# Patient Record
Sex: Female | Born: 1975 | Race: White | Hispanic: No | Marital: Married | Smoking: Never smoker
Health system: Southern US, Community
[De-identification: ages and names within clinical notes are randomized; demographics above are authoritative.]

## PROBLEM LIST (undated history)

## (undated) DIAGNOSIS — I10 Essential (primary) hypertension: Secondary | ICD-10-CM

## (undated) DIAGNOSIS — G43909 Migraine, unspecified, not intractable, without status migrainosus: Secondary | ICD-10-CM

## (undated) HISTORY — DX: Migraine, unspecified, not intractable, without status migrainosus: G43.909

## (undated) HISTORY — DX: Essential (primary) hypertension: I10

## (undated) HISTORY — PX: WISDOM TOOTH EXTRACTION: SHX21

---

## 2005-01-09 ENCOUNTER — Other Ambulatory Visit: Admission: RE | Admit: 2005-01-09 | Discharge: 2005-01-09 | Payer: Self-pay | Admitting: Obstetrics and Gynecology

## 2005-08-04 ENCOUNTER — Inpatient Hospital Stay (HOSPITAL_COMMUNITY): Admission: AD | Admit: 2005-08-04 | Discharge: 2005-08-07 | Payer: Self-pay | Admitting: Obstetrics and Gynecology

## 2009-06-25 ENCOUNTER — Inpatient Hospital Stay (HOSPITAL_COMMUNITY): Admission: AD | Admit: 2009-06-25 | Discharge: 2009-06-26 | Payer: Self-pay | Admitting: Obstetrics and Gynecology

## 2010-07-31 LAB — CBC
MCHC: 33.7 g/dL (ref 30.0–36.0)
MCV: 94.3 fL (ref 78.0–100.0)
Platelets: 146 10*3/uL — ABNORMAL LOW (ref 150–400)
Platelets: 188 10*3/uL (ref 150–400)
RDW: 13.7 % (ref 11.5–15.5)
RDW: 14 % (ref 11.5–15.5)

## 2010-07-31 LAB — RPR: RPR Ser Ql: NONREACTIVE

## 2015-11-09 DIAGNOSIS — G43019 Migraine without aura, intractable, without status migrainosus: Secondary | ICD-10-CM | POA: Diagnosis not present

## 2015-11-09 DIAGNOSIS — G43839 Menstrual migraine, intractable, without status migrainosus: Secondary | ICD-10-CM | POA: Diagnosis not present

## 2016-11-09 LAB — HM PAP SMEAR: HM PAP: NORMAL

## 2016-11-16 LAB — HM MAMMOGRAPHY: HM Mammogram: NORMAL (ref 0–4)

## 2016-12-17 DIAGNOSIS — Z1231 Encounter for screening mammogram for malignant neoplasm of breast: Secondary | ICD-10-CM | POA: Diagnosis not present

## 2016-12-17 DIAGNOSIS — Z6821 Body mass index (BMI) 21.0-21.9, adult: Secondary | ICD-10-CM | POA: Diagnosis not present

## 2016-12-17 DIAGNOSIS — Z01419 Encounter for gynecological examination (general) (routine) without abnormal findings: Secondary | ICD-10-CM | POA: Diagnosis not present

## 2017-05-19 ENCOUNTER — Ambulatory Visit: Payer: BLUE CROSS/BLUE SHIELD | Admitting: Family

## 2017-05-19 ENCOUNTER — Encounter: Payer: Self-pay | Admitting: Family

## 2017-05-19 VITALS — BP 152/101 | HR 85 | Temp 98.4°F | Resp 16 | Ht 67.5 in | Wt 136.0 lb

## 2017-05-19 DIAGNOSIS — G43909 Migraine, unspecified, not intractable, without status migrainosus: Secondary | ICD-10-CM | POA: Insufficient documentation

## 2017-05-19 DIAGNOSIS — I10 Essential (primary) hypertension: Secondary | ICD-10-CM

## 2017-05-19 MED ORDER — SUMATRIPTAN SUCCINATE 100 MG PO TABS: 100.0000 mg | ORAL_TABLET | ORAL | 0 refills | Status: DC | PRN

## 2017-05-19 MED ORDER — AMLODIPINE BESYLATE 5 MG PO TABS: 5.0000 mg | ORAL_TABLET | Freq: Every day | ORAL | 3 refills | Status: DC

## 2017-05-19 NOTE — Assessment & Plan Note (Signed)
Stable with as needed use of Imitrex.  She is [facility].

## 2017-05-19 NOTE — Assessment & Plan Note (Signed)
New-onset.  Will initiate amlodipine 5 mg once daily.

## 2017-05-19 NOTE — Progress Notes (Signed)
Subjective:    Patient ID: Marissa Levine, female    DOB: 04-01-1976, 42 y.o.   MRN: [id number]  HPI  Patient is a 42 year old female who presents today to establish care.  Her chief concern today is elevated blood pressure.  Reports BP is high at her GYN and again when she went to ophthalmology.  She has been monitoring her blood pressure at home.  Systolic blood pressure has been in the 140s-160s.  She is only had one blood pressure less than 140.  She reports a strong family history of hypertension. BP Readings from Last 3 Encounters:  05/19/17 (!) 152/101   She has lost 30 pounds over the past year.  This has been with healthy diet and exercise.    M[facility]raines- [facility]. Controlled with imitrex prn. Has a few a year.   Review of Systems  Constitutional: Negative for unexpected weight change.  HENT: Negative for hearing loss and rhinorrhea.   Eyes: Negative for visual disturbance.  Respiratory: Negative for cough.   Cardiovascular: Negative for leg swelling.  Gastrointestinal: Negative for blood in stool, constipation and diarrhea.  Genitourinary: Negative for dysuria, frequency and menstrual problem.  Musculoskeletal: Negative for arthralgias and myalgias.  Skin: Negative for rash.  Neurological:       HA's see HPI  Hematological: Negative for adenopathy.  Psychiatric/Behavioral:       Denies depression/anxiety    History reviewed. No pertinent past medical history.   Social History   Socioeconomic History  . Marital status: Married    Spouse name: Glendell Docker  . Number of children: 2  . Years of education: 6  . Highest education level: Bachelor's degree (e.g., BA, AB, BS)  Social Needs  . Financial resource strain: Not hard at all  . Food insecurity - worry: Patient refused  . Food insecurity - inability: Patient refused  . Transportation needs - medical: Patient refused  . Transportation needs - non-medical: Patient refused  Occupational History    . 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 Smoking status: Never Smoker  . Smokeless tobacco: Never Used  Substance and Sexual Activity  . Alcohol use: Yes    Alcohol/week: 0.6 oz    Types: 1 Glasses of wine per week    Comment: glass wine with diner  . Drug use: No  . Sexual activity: Yes    Partners: Male    Birth control/protection: None    Comment: Husband had vasectomy  Other Topics Concern  . 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 No pertinent surgical history.  Family History  Problem Relation Age of Onset  . Cancer Mother 84       breast  . Hypertension Mother   . Cancer Father        basal cell carcinoma  . Hypertension Father   . Hypertension Maternal Grandmother   . Hypertension Maternal Grandfather   . Hypertension Paternal Grandmother   . Hypertension Paternal Grandfather   . Heart attack Paternal Grandfather     Not on File  No current outpatient medications on file prior to visit.   No current facility-administered medications on file prior to visit.     BP Marland Kitchen)  152/101 (BP Location: Right Arm, Patient Position: Sitting, Cuff Size: Small)   Pulse 85   Temp 98.4 F (36.9 C) (Oral)   Resp 16   Ht 5' 7.5" (1.715 m)   Wt 136 lb (61.7 kg)   LMP 05/05/2017 Comment: Husband had a vasectomy  SpO2 100%   BMI 20.99 kg/m        Objective:   Physical Exam  Constitutional: She is oriented to person, place, and time. She appears well-developed and well-nourished.  HENT:  Head: Normocephalic and atraumatic.  Cardiovascular: Normal rate, regular rhythm and normal heart sounds.  No murmur heard. Pulmonary/Chest: Effort normal and breath sounds normal. No respiratory distress. She has no wheezes.  Musculoskeletal: She exhibits no edema.  Neurological: She  is alert and oriented to person, place, and time.  Psychiatric: She has a normal mood and affect. Her behavior is normal. Judgment and thought content normal.          Assessment & Plan:   A total of 20  minutes were spent face-to-face with the patient during this encounter and over half of that time was spent on counseling and coordination of care. The patient was counseled on hypertension and its treatment/disease course.

## 2017-05-20 DIAGNOSIS — G43839 Menstrual migraine, intractable, without status migrainosus: Secondary | ICD-10-CM | POA: Diagnosis not present

## 2017-05-20 DIAGNOSIS — G43019 Migraine without aura, intractable, without status migrainosus: Secondary | ICD-10-CM | POA: Diagnosis not present

## 2017-05-26 ENCOUNTER — Encounter: Payer: Self-pay | Admitting: Family

## 2017-05-29 MED ORDER — AMLODIPINE BESYLATE 10 MG PO TABS: 10.0000 mg | ORAL_TABLET | Freq: Every day | ORAL | 3 refills | Status: DC

## 2017-05-29 NOTE — Telephone Encounter (Signed)
Patient has sent in BP readings.

## 2017-05-29 NOTE — Addendum Note (Signed)
Addended by: Debbrah Alar on: 05/29/2017 08:57 AM   Modules accepted: Orders

## 2017-06-10 ENCOUNTER — Ambulatory Visit (INDEPENDENT_AMBULATORY_CARE_PROVIDER_SITE_OTHER): Payer: BLUE CROSS/BLUE SHIELD | Admitting: Family

## 2017-06-10 ENCOUNTER — Encounter: Payer: Self-pay | Admitting: Family

## 2017-06-10 VITALS — BP 142/90 | HR 80 | Temp 98.1°F | Resp 16 | Ht 67.5 in | Wt 138.4 lb

## 2017-06-10 DIAGNOSIS — Z0000 Encounter for general adult medical examination without abnormal findings: Secondary | ICD-10-CM

## 2017-06-10 DIAGNOSIS — I10 Essential (primary) hypertension: Secondary | ICD-10-CM | POA: Diagnosis not present

## 2017-06-10 LAB — CBC WITH DIFFERENTIAL/PLATELET
BASOS ABS: 0 10*3/uL (ref 0.0–0.1)
Basophils Relative: 0.7 % (ref 0.0–3.0)
EOS ABS: 0.2 10*3/uL (ref 0.0–0.7)
EOS PCT: 3.2 % (ref 0.0–5.0)
HCT: 43.3 % (ref 36.0–46.0)
Hemoglobin: 14.9 g/dL (ref 12.0–15.0)
Lymphocytes Relative: 33.8 % (ref 12.0–46.0)
Lymphs Abs: 1.7 10*3/uL (ref 0.7–4.0)
MCHC: 34.4 g/dL (ref 30.0–36.0)
MCV: 91.7 fl (ref 78.0–100.0)
MONO ABS: 0.4 10*3/uL (ref 0.1–1.0)
Monocytes Relative: 7.1 % (ref 3.0–12.0)
Neutro Abs: 2.8 10*3/uL (ref 1.4–7.7)
Neutrophils Relative %: 55.2 % (ref 43.0–77.0)
Platelets: 273 10*3/uL (ref 150.0–400.0)
RBC: 4.73 Mil/uL (ref 3.87–5.11)
RDW: 13.4 % (ref 11.5–15.5)
WBC: 5 10*3/uL (ref 4.0–10.5)

## 2017-06-10 LAB — BASIC METABOLIC PANEL
BUN: 16 mg/dL (ref 6–23)
CALCIUM: 9.1 mg/dL (ref 8.4–10.5)
CO2: 29 mEq/L (ref 19–32)
Chloride: 105 mEq/L (ref 96–112)
Creatinine, Ser: 0.82 mg/dL (ref 0.40–1.20)
GFR: 80.7 mL/min (ref >60.00)
Glucose, Bld: 83 mg/dL (ref 70–99)
Potassium: 4.5 mEq/L (ref 3.5–5.1)
Sodium: 141 mEq/L (ref 135–145)

## 2017-06-10 LAB — URINALYSIS, ROUTINE W REFLEX MICROSCOPIC
Bilirubin Urine: NEGATIVE
Hgb urine dipstick: NEGATIVE
Ketones, ur: NEGATIVE
Leukocytes, UA: NEGATIVE
Nitrite: NEGATIVE
RBC / HPF: NONE SEEN (ref 0–?)
SPECIFIC GRAVITY, URINE: 1.015 (ref 1.000–1.030)
TOTAL PROTEIN, URINE-UPE24: NEGATIVE
URINE GLUCOSE: NEGATIVE
Urobilinogen, UA: 0.2 (ref 0.0–1.0)
pH: 7.5 (ref 5.0–8.0)

## 2017-06-10 LAB — HEPATIC FUNCTION PANEL
ALK PHOS: 43 U/L (ref 39–117)
ALT: 12 U/L (ref 0–35)
AST: 14 U/L (ref 0–37)
Albumin: 4.2 g/dL (ref 3.5–5.2)
BILIRUBIN DIRECT: 0.1 mg/dL (ref 0.0–0.3)
BILIRUBIN TOTAL: 0.6 mg/dL (ref 0.2–1.2)
Total Protein: 7 g/dL (ref 6.0–8.3)

## 2017-06-10 LAB — TSH: TSH: 2.07 u[IU]/mL (ref 0.35–4.50)

## 2017-06-10 LAB — LIPID PANEL
CHOL/HDL RATIO: 2
Cholesterol: 162 mg/dL (ref 0–200)
HDL: 76.8 mg/dL (ref >39.00)
LDL CALC: 78 mg/dL (ref 0–99)
NonHDL: 84.9
Triglycerides: 35 mg/dL (ref 0.0–149.0)
VLDL: 7 mg/dL (ref 0.0–40.0)

## 2017-06-10 MED ORDER — HYDROCHLOROTHIAZIDE 25 MG PO TABS: 25.0000 mg | ORAL_TABLET | Freq: Every day | ORAL | 3 refills | Status: DC

## 2017-06-10 MED ORDER — POTASSIUM CHLORIDE ER 10 MEQ PO TBCR: 10.0000 meq | EXTENDED_RELEASE_TABLET | Freq: Every day | ORAL | 5 refills | Status: DC

## 2017-06-10 NOTE — Progress Notes (Signed)
Subjective:    Patient ID: Marissa Levine, female    DOB: 02/02/1976, 42 y.o.   MRN: [id number]  HPI  Patient presents today for complete physical.  Immunizations: tdap 2015, declines flu shot Diet: healthy diet Exercise: enjoys running, weights Pap Smear:  7/18 Mammogram: 7/18 Vision:  Up to date Dental: every 6 months.   HTN- last visit we started amlodipine 5mg .  Her home readings were still high so we increased to 10mg .   BP Readings from Last 3 Encounters:  06/10/17 (!) 142/90  05/19/17 (!) 152/101   Home readings on the 10mg  are as follows:   123/94  136/102  131/89  129/96  127/95     Review of Systems  Constitutional: Negative for unexpected weight change.  HENT: Negative for hearing loss and rhinorrhea.   Eyes: Negative for visual disturbance.  Respiratory: Negative for cough.   Cardiovascular: Negative for leg swelling.  Gastrointestinal: Negative for blood in stool, constipation and diarrhea.  Genitourinary: Negative for dysuria, frequency, hematuria and menstrual problem.  Musculoskeletal: Negative for arthralgias and myalgias.  Skin: Negative for rash.  Neurological:       About 1 migraine/month  Hematological: Negative for adenopathy.  Psychiatric/Behavioral:       Denies depression/anxiety   Past Medical History:  Diagnosis Date  . Migraine      Social History   Socioeconomic History  . Marital status: Married    Spouse name: Glendell Docker  . Number of children: 2  . Years of education: 27  . Highest education level: Bachelor's degree (e.g., BA, AB, BS)  Social Needs  . Financial resource strain: Not hard at all  . Food insecurity - worry: Patient refused  . Food insecurity - inability: Patient refused  . Transportation needs - medical: Patient refused  . Transportation needs - non-medical: Patient refused  Occupational History  . 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 Smoking status: Never Smoker  .  Smokeless tobacco: Never Used  Substance and Sexual Activity  . Alcohol use: Yes    Alcohol/week: 0.6 oz    Types: 1 Glasses of wine per week    Comment: glass wine with diner  . Drug use: No  . Sexual activity: Yes    Partners: Male    Birth control/protection: None    Comment: Husband had vasectomy  Other Topics Concern  . 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  Family History  Problem Relation Age of Onset  . Cancer Mother 41       breast  . Hypertension Mother   . Cancer Father        basal cell carcinoma  . Hypertension Father   . Hypertension Maternal Grandmother   . Hypertension Maternal Grandfather   . Hypertension Paternal Grandmother   . Hypertension Paternal Grandfather   . Heart attack Paternal Grandfather     No Known Allergies  Current Outpatient Medications on File Prior to Visit  Medication Sig Dispense Refill  . amLODipine (NORVASC) 10 MG tablet Take 1 tablet (10 mg total) by mouth daily. 30 tablet 3  . SUMAtriptan (IMITREX) 100 MG tablet Take 1 tablet (100 mg total) by mouth every 2 (two)  hours as needed for migraine. May repeat in 2 hours if headache persists or recurs. 10 tablet 0   No current facility-administered medications on file prior to visit.     BP (!) 142/90 (BP Location: Right Arm, Cuff Size: Normal)   Pulse 80   Temp 98.1 F (36.7 C) (Oral)   Resp 16   Ht 5' 7.5" (1.715 m)   Wt 138 lb 6.4 oz (62.8 kg)   SpO2 100%   BMI 21.36 kg/m       Objective:   Physical Exam  Physical Exam  Constitutional: She is oriented to person, place, and time. She appears well-developed and well-nourished. No distress.  HENT:  Head: Normocephalic and atraumatic.  Right Ear: Tympanic membrane and ear canal normal.  Left Ear: Tympanic membrane and ear canal normal.    Mouth/Throat: Oropharynx is clear and moist.  Eyes: Pupils are equal, round, and reactive to light. No scleral icterus.  Neck: Normal range of motion. No thyromegaly present.  Cardiovascular: Normal rate and regular rhythm.   No murmur heard. Pulmonary/Chest: Effort normal and breath sounds normal. No respiratory distress. He has no wheezes. She has no rales. She exhibits no tenderness.  Abdominal: Soft. Bowel sounds are normal. She exhibits no distension and no mass. There is no tenderness. There is no rebound and no guarding.  Musculoskeletal: She exhibits no edema.  Lymphadenopathy:    She has no cervical adenopathy.  Neurological: She is alert and oriented to person, place, and time. She has normal patellar reflexes. She exhibits normal muscle tone. Coordination normal.  Skin: Skin is warm and dry.  Psychiatric: She has a normal mood and affect. Her behavior is normal. Judgment and thought content normal.  Breasts: Examined lying Right: Without masses, retractions, discharge or axillary adenopathy.  Left: Without masses, retractions, discharge or axillary adenopathy.  Inguinal/mons: Normal without inguinal adenopathy  External genitalia: Normal  BUS/Urethra/Skene's glands: Normal  Bladder: Normal  Vagina: Normal  Cervix: Normal  Uterus: normal in size, shape and contour. Midline and mobile  Adnexa/parametria:  Rt: Without masses or tenderness.  Lt: Without masses or tenderness.  Anus and perineum: Normal            Assessment & Plan:   Preventative care- discussed continuing healthy diet, regular exercise. Will obtain routine lab work. Tdap up to date.  Declines flu shot.       Assessment & Plan:  EKG tracing is personally reviewed.  EKG notes NSR.  No acute changes.   HTN- uncontrolled, continue amlodipine, add hctz once daily as well as a low dose potassium supplement to avoid hypokalemia.

## 2017-06-10 NOTE — Patient Instructions (Signed)
Please complete lab work prior to leaving. Keep up the good work with healthy diet and regular exercise. Add hctz (fluid pill) once daily for blood pressure along with potassium tablet.

## 2017-06-24 ENCOUNTER — Ambulatory Visit: Payer: BLUE CROSS/BLUE SHIELD

## 2017-06-24 DIAGNOSIS — I10 Essential (primary) hypertension: Secondary | ICD-10-CM

## 2017-06-24 MED ORDER — METOPROLOL SUCCINATE ER 50 MG PO TB24: 50.0000 mg | ORAL_TABLET | Freq: Every day | ORAL | 3 refills | Status: DC

## 2017-06-24 NOTE — Progress Notes (Signed)
Pt came in today for BP reading. Pt states that she's been keeping track of it at home and her readings are still high to her (highest reported was 130/100). After consulting with Dr. Nani Ravens Pt is to start taking Metoprolol 50mg  onace daily and follow up with PCP in 2 weeks. Pt stated compliance and understanding.

## 2017-07-07 ENCOUNTER — Ambulatory Visit: Payer: BLUE CROSS/BLUE SHIELD | Admitting: Family

## 2017-07-07 ENCOUNTER — Encounter: Payer: Self-pay | Admitting: Family

## 2017-07-07 VITALS — BP 125/84 | HR 52 | Temp 98.3°F | Resp 16 | Ht 68.0 in | Wt 136.2 lb

## 2017-07-07 DIAGNOSIS — E876 Hypokalemia: Secondary | ICD-10-CM

## 2017-07-07 DIAGNOSIS — I10 Essential (primary) hypertension: Secondary | ICD-10-CM | POA: Diagnosis not present

## 2017-07-07 NOTE — Progress Notes (Signed)
Subjective:    Patient ID: Marissa Levine, female    DOB: Jan 01, 1976, 42 y.o.   MRN: [id number]  HPI   HTN-  Maintained on metoprolol, hctz, amlodipine. Was sleepy the first few days on beta blocker but not feeling better.   BP Readings from Last 3 Encounters:  07/07/17 125/84  06/10/17 (!) 142/90  05/19/17 (!) 152/101      Review of Systems   See hpi  Past Medical History:  Diagnosis Date  . Migraine      Social History   Socioeconomic History  . Marital status: Married    Spouse name: Glendell Docker  . Number of children: 2  . Years of education: 45  . Highest education level: Bachelor's degree (e.g., BA, AB, BS)  Social Needs  . Financial resource strain: Not hard at all  . Food insecurity - worry: Patient refused  . Food insecurity - inability: Patient refused  . Transportation needs - medical: Patient refused  . Transportation needs - non-medical: Patient refused  Occupational History  . 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 Smoking status: Never Smoker  . Smokeless tobacco: Never Used  Substance and Sexual Activity  . Alcohol use: Yes    Alcohol/week: 0.6 oz    Types: 1 Glasses of wine per week    Comment: glass wine with diner  . Drug use: No  . Sexual activity: Yes    Partners: Male    Birth control/protection: None    Comment: Husband had vasectomy  Other Topics Concern  . 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  Family History  Problem Relation Age of Onset  . Cancer Mother 44       breast  . Hypertension Mother   . Cancer Father        basal cell carcinoma  . Hypertension Father   . Hypertension Maternal Grandmother   . Hypertension Maternal Grandfather   . Hypertension Paternal Grandmother   . Hypertension Paternal Grandfather    . Heart attack Paternal Grandfather     No Known Allergies  Current Outpatient Medications on File Prior to Visit  Medication Sig Dispense Refill  . amLODipine (NORVASC) 10 MG tablet Take 1 tablet (10 mg total) by mouth daily. 30 tablet 3  . hydrochlorothiazide (HYDRODIURIL) 25 MG tablet Take 1 tablet (25 mg total) by mouth daily. 30 tablet 3  . metoprolol succinate (TOPROL-XL) 50 MG 24 hr tablet Take 1 tablet (50 mg total) by mouth daily. Take with or immediately following a meal. 90 tablet 3  . potassium chloride (K-DUR) 10 MEQ tablet Take 1 tablet (10 mEq total) by mouth daily. 30 tablet 5  . SUMAtriptan (IMITREX) 100 MG tablet Take 1 tablet (100 mg total) by mouth every 2 (two) hours as needed for migraine. May repeat in 2 hours if headache persists or recurs. 10 tablet 0   No current facility-administered medications on file prior to visit.     BP 125/84 (BP Location: Right Arm, Patient Position: Sitting, Cuff Size: Normal)   Pulse (!) 52   Temp 98.3 F (36.8 C) (Oral)   Resp 16   Ht 5\' 8"  (1.727 m)   Wt 136  lb 3.2 oz (61.8 kg)   SpO2 100%   BMI 20.71 kg/m       Objective:   Physical Exam  Constitutional: She appears well-developed and well-nourished.  Cardiovascular: Normal rate, regular rhythm and normal heart sounds.  No murmur heard. Pulmonary/Chest: Effort normal and breath sounds normal. No respiratory distress. She has no wheezes.  Psychiatric: She has a normal mood and affect. Her behavior is normal. Judgment and thought content normal.          Assessment & Plan:  HTN- she is upset about being on 4 different meds for bp.  (including Kdur).  Feels like the only med that has helped her bp is metoprolol. She would like to limit her number of pills. Advised her that we can try stopping hctz and kdur and repeat bp and bmet in 1 week.  If bp above goal will need to add hctz back in. Pt verbalizes understanding

## 2017-07-07 NOTE — Patient Instructions (Signed)
Stop hctz and Kdur.

## 2017-07-23 ENCOUNTER — Other Ambulatory Visit: Payer: BLUE CROSS/BLUE SHIELD

## 2017-07-23 ENCOUNTER — Ambulatory Visit: Payer: BLUE CROSS/BLUE SHIELD

## 2017-07-25 ENCOUNTER — Encounter: Payer: Self-pay | Admitting: Family

## 2017-07-27 ENCOUNTER — Encounter: Payer: Self-pay | Admitting: Family

## 2017-09-21 ENCOUNTER — Other Ambulatory Visit: Payer: Self-pay | Admitting: Family

## 2018-04-19 ENCOUNTER — Other Ambulatory Visit: Payer: Self-pay | Admitting: Family

## 2018-05-19 ENCOUNTER — Ambulatory Visit: Payer: BLUE CROSS/BLUE SHIELD | Admitting: Family

## 2018-05-19 ENCOUNTER — Other Ambulatory Visit: Payer: Self-pay | Admitting: Family

## 2018-05-19 ENCOUNTER — Encounter: Payer: Self-pay | Admitting: Family

## 2018-05-19 VITALS — BP 123/84 | Temp 98.1°F | Resp 16 | Ht 68.0 in | Wt 145.0 lb

## 2018-05-19 DIAGNOSIS — G43909 Migraine, unspecified, not intractable, without status migrainosus: Secondary | ICD-10-CM

## 2018-05-19 DIAGNOSIS — I10 Essential (primary) hypertension: Secondary | ICD-10-CM | POA: Diagnosis not present

## 2018-05-19 LAB — BASIC METABOLIC PANEL
BUN: 12 mg/dL (ref 6–23)
CALCIUM: 9.6 mg/dL (ref 8.4–10.5)
CO2: 29 meq/L (ref 19–32)
Chloride: 104 mEq/L (ref 96–112)
Creatinine, Ser: 0.84 mg/dL (ref 0.40–1.20)
GFR: 78.15 mL/min (ref >60.00)
GLUCOSE: 67 mg/dL — AB (ref 70–99)
POTASSIUM: 4.6 meq/L (ref 3.5–5.1)
SODIUM: 138 meq/L (ref 135–145)

## 2018-05-19 MED ORDER — METOPROLOL SUCCINATE ER 50 MG PO TB24: 50.0000 mg | ORAL_TABLET | Freq: Every day | ORAL | 1 refills | Status: DC

## 2018-05-19 MED ORDER — AMLODIPINE BESYLATE 5 MG PO TABS: 5.0000 mg | ORAL_TABLET | Freq: Every day | ORAL | 1 refills | Status: DC

## 2018-05-19 NOTE — Progress Notes (Signed)
Subjective:    Patient ID: Marissa Levine, female    DOB: [birth year], 43 y.o.   MRN: [id number]  HPI   Patient is a 43 yr old female who presents today for follow up.  HTN- maintained on amlodipine and metoprolol. Denies CP/SOB or swelling.  BP Readings from Last 3 Encounters:  05/19/18 123/84  07/07/17 125/84  06/10/17 (!) 142/90   Migraines- reports well controlled. No recent migraines since she got her blood pressure under control.   Review of Systems See HPI  Past Medical History:  Diagnosis Date  . Migraine      Social History   Socioeconomic History  . Marital status: Married    Spouse name: Glendell Docker  . Number of children: 2  . Years of education: 17  . Highest education level: Bachelor's degree (e.g., BA, AB, BS)  Occupational History  . 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 Financial resource strain: Not hard at all  . Food insecurity:    Worry: Patient refused    Inability: Patient refused  . Transportation needs:    Medical: Patient refused    Non-medical: Patient refused  Tobacco Use  . Smoking status: Never Smoker  . Smokeless tobacco: Never Used  Substance and Sexual Activity  . Alcohol use: Yes    Alcohol/week: 1.0 standard drinks    Types: 1 Glasses of wine per week    Comment: glass wine with diner  . Drug use: No  . Sexual activity: Yes    Partners: Male    Birth control/protection: None    Comment: Husband had vasectomy  Lifestyle  . Physical activity:    Days per week: 7 days    Minutes per session: 60 min  . Stress: Rather much  Relationships  . Social connections:    Talks on phone: More than three times a week    Gets together: Twice a week    Attends religious service: 1 to 4 times per year    Active member of club or organization: Yes    Attends meetings of clubs or organizations: 1 to 4 times per year    Relationship status: Married  . Intimate partner violence:    Fear of current or ex  partner: Patient refused    Emotionally abused: Patient refused    Physically abused: Patient refused    Forced sexual activity: Patient refused  Other Topics Concern  . 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  Family History  Problem Relation Age of Onset  . Cancer Mother 53       breast  . Hypertension Mother   . Cancer Father        basal cell carcinoma  . Hypertension Father   . Hypertension Maternal Grandmother   . Hypertension Maternal Grandfather   . Hypertension Paternal Grandmother   . Hypertension Paternal Grandfather   . Heart attack Paternal Grandfather     No Known Allergies  Current Outpatient Medications on File Prior to Visit  Medication Sig Dispense Refill  . SUMAtriptan (IMITREX) 100 MG tablet Take 1 tablet (100 mg total) by mouth every 2 (two) hours as needed for migraine. May repeat in 2 hours if  headache persists or recurs. 10 tablet 0   No current facility-administered medications on file prior to visit.     BP 123/84 (BP Location: Right Arm, Patient Position: Sitting, Cuff Size: Small)   Temp 98.1 F (36.7 C) (Oral)   Resp 16   Ht 5\' 8"  (1.727 m)   Wt 145 lb (65.8 kg)   SpO2 100%   BMI 22.05 kg/m       Objective:   Physical Exam Constitutional:      Appearance: She is well-developed.  Cardiovascular:     Rate and Rhythm: Normal rate and regular rhythm.     Heart sounds: Normal heart sounds. No murmur.  Pulmonary:     Effort: Pulmonary effort is normal. No respiratory distress.     Breath sounds: Normal breath sounds. No wheezing.  Psychiatric:        Behavior: Behavior normal.        Thought Content: Thought content normal.        Judgment: Judgment normal.           Assessment & Plan:  HTN-  bp is stable on current meds continue same.  Migraines-  stable, continue prn imitrex.

## 2018-05-31 ENCOUNTER — Encounter: Payer: Self-pay | Admitting: Family

## 2018-08-18 ENCOUNTER — Encounter: Payer: BLUE CROSS/BLUE SHIELD | Admitting: Family

## 2018-12-02 ENCOUNTER — Other Ambulatory Visit: Payer: Self-pay | Admitting: Family

## 2019-01-07 ENCOUNTER — Encounter: Payer: Self-pay | Admitting: Family

## 2019-01-07 ENCOUNTER — Other Ambulatory Visit: Payer: Self-pay | Admitting: Family

## 2019-01-07 ENCOUNTER — Other Ambulatory Visit: Payer: Self-pay

## 2019-01-07 MED ORDER — METOPROLOL SUCCINATE ER 50 MG PO TB24: 50.0000 mg | ORAL_TABLET | Freq: Every day | ORAL | 5 refills | Status: DC

## 2019-01-07 MED ORDER — AMLODIPINE BESYLATE 5 MG PO TABS: 5.0000 mg | ORAL_TABLET | Freq: Every day | ORAL | 5 refills | Status: DC

## 2019-01-07 NOTE — Telephone Encounter (Signed)
Lvm for patient to call and schedule past due follow up appointment

## 2019-01-07 NOTE — Telephone Encounter (Signed)
Please contact patient and let her know that she is past due for follow up of her blood pressure. I sent a 30 day supply but she needs OV prior to additional refills.

## 2019-01-11 ENCOUNTER — Ambulatory Visit: Payer: BLUE CROSS/BLUE SHIELD | Admitting: Family

## 2019-01-18 ENCOUNTER — Encounter: Payer: Self-pay | Admitting: Family

## 2019-01-18 ENCOUNTER — Ambulatory Visit (INDEPENDENT_AMBULATORY_CARE_PROVIDER_SITE_OTHER): Payer: BC Managed Care – PPO | Admitting: Family

## 2019-01-18 ENCOUNTER — Other Ambulatory Visit: Payer: Self-pay

## 2019-01-18 VITALS — BP 111/66 | HR 54 | Temp 97.5°F | Resp 12 | Ht 68.0 in | Wt 145.2 lb

## 2019-01-18 DIAGNOSIS — I10 Essential (primary) hypertension: Secondary | ICD-10-CM | POA: Diagnosis not present

## 2019-01-18 DIAGNOSIS — G43909 Migraine, unspecified, not intractable, without status migrainosus: Secondary | ICD-10-CM

## 2019-01-18 MED ORDER — AMLODIPINE BESYLATE 5 MG PO TABS: 5.0000 mg | ORAL_TABLET | Freq: Every day | ORAL | 1 refills | Status: DC

## 2019-01-18 MED ORDER — METOPROLOL SUCCINATE ER 50 MG PO TB24: 50.0000 mg | ORAL_TABLET | Freq: Every day | ORAL | 1 refills | Status: DC

## 2019-01-18 NOTE — Progress Notes (Signed)
Subjective:    Patient ID: Marissa Levine, female    DOB: 05-12-1976, 43 y.o.   MRN: [id number]  HPI  Patient presents today for follow up.  HTN- Denies CP/SOB or swelling.  BP Readings from Last 3 Encounters:  01/18/19 111/66  05/19/18 123/84  07/07/17 125/84   Hx of migraines. Denies any recent migraines.   Review of Systems    see HPI  Past Medical History:  Diagnosis Date  . Migraine      Social History   Socioeconomic History  . Marital status: Married    Spouse name: Glendell Docker  . Number of children: 2  . Years of education: 43  . Highest education level: Bachelor's degree (e.g., BA, AB, BS)  Occupational History  . 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 Financial resource strain: Not hard at all  . Food insecurity    Worry: Patient refused    Inability: Patient refused  . Transportation needs    Medical: Patient refused    Non-medical: Patient refused  Tobacco Use  . Smoking status: Never Smoker  . Smokeless tobacco: Never Used  Substance and Sexual Activity  . Alcohol use: Yes    Alcohol/week: 1.0 standard drinks    Types: 1 Glasses of wine per week    Comment: glass wine with diner  . Drug use: No  . Sexual activity: Yes    Partners: Male    Birth control/protection: None    Comment: Husband had vasectomy  Lifestyle  . Physical activity    Days per week: 7 days    Minutes per session: 60 min  . Stress: Rather much  Relationships  . Social connections    Talks on phone: More than three times a week    Gets together: Twice a week    Attends religious service: 1 to 4 times per year    Active member of club or organization: Yes    Attends meetings of clubs or organizations: 1 to 4 times per year    Relationship status: Married  . Intimate partner violence    Fear of current or ex partner: Patient refused    Emotionally abused: Patient refused    Physically abused: Patient refused    Forced sexual activity:  Patient refused  Other Topics Concern  . 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  Family History  Problem Relation Age of Onset  . Cancer Mother 71       breast  . Hypertension Mother   . Cancer Father        basal cell carcinoma  . Hypertension Father   . Hypertension Maternal Grandmother   . Hypertension Maternal Grandfather   . Hypertension Paternal Grandmother   . Hypertension Paternal Grandfather   . Heart attack Paternal Grandfather     No Known Allergies  Current Outpatient Medications on File Prior to Visit  Medication Sig Dispense Refill  . SUMAtriptan (IMITREX) 100 MG tablet Take 1 tablet (100 mg total) by mouth every 2 (two) hours as needed for migraine. May repeat in 2 hours if headache persists or recurs. 10 tablet 0   No current facility-administered medications on file prior to visit.  BP 111/66 (BP Location: Right Arm, Cuff Size: Normal)   Pulse (!) 54   Temp (!) 97.5 F (36.4 C) (Temporal)   Resp 12   Ht 5\' 8"  (1.727 m)   Wt 145 lb 3.2 oz (65.9 kg)   LMP 01/11/2019   SpO2 100%   BMI 22.08 kg/m    Objective:   Physical Exam Constitutional:      Appearance: She is well-developed.  Neck:     Musculoskeletal: Neck supple.     Thyroid: No thyromegaly.  Cardiovascular:     Rate and Rhythm: Normal rate and regular rhythm.     Heart sounds: Normal heart sounds. No murmur.  Pulmonary:     Effort: Pulmonary effort is normal. No respiratory distress.     Breath sounds: Normal breath sounds. No wheezing.  Skin:    General: Skin is warm and dry.  Neurological:     Mental Status: She is alert and oriented to person, place, and time.  Psychiatric:        Behavior: Behavior normal.        Thought Content: Thought content normal.        Judgment: Judgment normal.            Assessment & Plan:  HTN-bp looks great. I asked her to check blood pressure once daily for 1 week and send me her readings via mychart.  Might consider decreasing amlodipine from 5mg  to 2.5mg  once daily if SBP remains <120.    Hx of migraines- stable no recent migraines.  Monitor.

## 2019-04-01 ENCOUNTER — Other Ambulatory Visit: Payer: Self-pay | Admitting: *Deleted

## 2019-04-01 MED ORDER — AMLODIPINE BESYLATE 5 MG PO TABS: 5.0000 mg | ORAL_TABLET | Freq: Every day | ORAL | 1 refills | Status: DC

## 2019-06-24 ENCOUNTER — Other Ambulatory Visit: Payer: Self-pay | Admitting: Family

## 2019-07-25 ENCOUNTER — Other Ambulatory Visit: Payer: Self-pay | Admitting: Family

## 2019-11-14 ENCOUNTER — Other Ambulatory Visit: Payer: Self-pay | Admitting: Family

## 2019-11-15 NOTE — Telephone Encounter (Signed)
Please contact pt to schedule a follow up visit with me.

## 2019-11-16 NOTE — Telephone Encounter (Signed)
Called pt left msg to callback for an appt before any future refills

## 2019-11-25 ENCOUNTER — Ambulatory Visit: Payer: BC Managed Care – PPO | Admitting: Family

## 2019-12-02 ENCOUNTER — Ambulatory Visit: Payer: BC Managed Care – PPO | Admitting: Family

## 2019-12-02 ENCOUNTER — Other Ambulatory Visit: Payer: Self-pay

## 2019-12-02 ENCOUNTER — Encounter: Payer: Self-pay | Admitting: Family

## 2019-12-02 VITALS — BP 108/65 | Temp 98.2°F | Resp 12 | Ht 68.0 in | Wt 150.6 lb

## 2019-12-02 DIAGNOSIS — G43909 Migraine, unspecified, not intractable, without status migrainosus: Secondary | ICD-10-CM | POA: Diagnosis not present

## 2019-12-02 DIAGNOSIS — I10 Essential (primary) hypertension: Secondary | ICD-10-CM | POA: Diagnosis not present

## 2019-12-02 MED ORDER — METOPROLOL SUCCINATE ER 50 MG PO TB24: ORAL_TABLET | ORAL | 1 refills | Status: DC

## 2019-12-02 MED ORDER — AMLODIPINE BESYLATE 5 MG PO TABS: 2.5000 mg | ORAL_TABLET | Freq: Every day | ORAL | 1 refills | Status: DC

## 2019-12-02 NOTE — Progress Notes (Signed)
Subjective:    Patient ID: Marissa Levine, female    DOB: 03-18-1976, 44 y.o.   MRN: [id number]  HPI  Patient is a 44 yr old female who presents today for follow up of her hypertension.  HTN- Patient is maintained on amlodipine 5mg  and toprol xl 50mg .  BP Readings from Last 3 Encounters:  12/02/19 108/65  01/18/19 111/66  05/19/18 123/84   History of Migraines- has imitrex on hand for as needed use.    Review of Systems See HPI  Past Medical History:  Diagnosis Date  . Migraine      Social History   Socioeconomic History  . Marital status: Married    Spouse name: Glendell Docker  . Number of children: 2  . Years of education: 19  . Highest education level: Bachelor's degree (e.g., BA, AB, BS)  Occupational History  . 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 Smoking status: Never Smoker  . Smokeless tobacco: Never Used  Vaping Use  . Vaping Use: Never used  Substance and Sexual Activity  . Alcohol use: Yes    Alcohol/week: 1.0 standard drink    Types: 1 Glasses of wine per week    Comment: glass wine with diner  . Drug use: No  . Sexual activity: Yes    Partners: Male    Birth control/protection: None    Comment: Husband had vasectomy  Other Topics Concern  . 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 Difficulty of Paying Living Expenses:   Food Insecurity:   . Worried About Charity fundraiser in the Last Year:   . Arboriculturist in the Last Year:   Transportation Needs:   . Film/video editor (Medical):   Marland Kitchen Lack of Transportation (Non-Medical):   Physical Activity:   . Days of Exercise per Week:   . Minutes of Exercise per Session:   Stress:   . Feeling of Stress :   Social Connections:   . Frequency of  Communication with Friends and Family:   . Frequency of Social Gatherings with Friends and Family:   . Attends Religious Services:   . Active Member of Clubs or Organizations:   . Attends Archivist Meetings:   Marland Kitchen Marital Status:   Intimate Partner Violence:   . Fear of Current or Ex-Partner:   . Emotionally Abused:   Marland Kitchen Physically Abused:   . Sexually Abused:     No past surgical history on file.  Family History  Problem Relation Age of Onset  . Cancer Mother 6       breast  . Hypertension Mother   . Cancer Father        basal cell carcinoma  . Hypertension Father   . Hypertension Maternal Grandmother   . Hypertension Maternal Grandfather   . Hypertension Paternal Grandmother   . Hypertension Paternal Grandfather   . Heart attack Paternal Grandfather     No Known Allergies  Current Outpatient Medications on File Prior to Visit  Medication Sig Dispense Refill  . amLODipine (NORVASC) 5 MG tablet Take 1 tablet (5 mg total) by mouth daily. 90 tablet 1  . metoprolol  succinate (TOPROL-XL) 50 MG 24 hr tablet TAKE 1 TABLET DAILY WITH OR IMMEDIATELY FOLLOWING A MEAL (NEED FOLLOW UP FOR ASSESSMENT PRIOR TO ADDITIONAL REFILLS OR GREATER QUANTITY) 30 tablet 0   No current facility-administered medications on file prior to visit.    BP 108/65 (BP Location: Right Arm, Cuff Size: Normal)   Temp 98.2 F (36.8 C) (Oral)   Resp 12   Ht 5\' 8"  (1.727 m)   Wt 150 lb 9.6 oz (68.3 kg)   LMP 11/18/2019   SpO2 100%   BMI 22.90 kg/m       Objective:   Physical Exam Constitutional:      Appearance: She is well-developed.  Neck:     Thyroid: No thyromegaly.  Cardiovascular:     Rate and Rhythm: Normal rate and regular rhythm.     Heart sounds: Normal heart sounds. No murmur heard.   Pulmonary:     Effort: Pulmonary effort is normal. No respiratory distress.     Breath sounds: Normal breath sounds. No wheezing.  Musculoskeletal:     Cervical back: Neck supple.   Skin:    General: Skin is warm and dry.  Neurological:     Mental Status: She is alert and oriented to person, place, and time.  Psychiatric:        Behavior: Behavior normal.        Thought Content: Thought content normal.        Judgment: Judgment normal.           Assessment & Plan:  HTN- bp looks great today. I would like to try decreasing her amlodipine from 5mg  to 2.5mg  once daily. She will check her bp once daily at home and send me her readings via mychart.  Migraines- stable, no recent migraines. Continue metoprolol for bp and migraine prophylaxis.  This visit occurred during the SARS-CoV-2 public health emergency.  Safety protocols were in place, including screening questions prior to the visit, additional usage of staff PPE, and extensive cleaning of exam room while observing appropriate contact time as indicated for disinfecting solutions.

## 2019-12-30 DIAGNOSIS — Z1152 Encounter for screening for COVID-19: Secondary | ICD-10-CM | POA: Diagnosis not present

## 2020-01-13 ENCOUNTER — Ambulatory Visit (INDEPENDENT_AMBULATORY_CARE_PROVIDER_SITE_OTHER): Payer: BC Managed Care – PPO | Admitting: Family

## 2020-01-13 ENCOUNTER — Other Ambulatory Visit (HOSPITAL_COMMUNITY)
Admission: RE | Admit: 2020-01-13 | Discharge: 2020-01-13 | Disposition: A | Discharge status: Home / Self Care | Payer: BC Managed Care – PPO | Source: Ambulatory Visit | Attending: Family | Admitting: Family

## 2020-01-13 ENCOUNTER — Encounter: Payer: Self-pay | Admitting: Family

## 2020-01-13 ENCOUNTER — Ambulatory Visit (HOSPITAL_BASED_OUTPATIENT_CLINIC_OR_DEPARTMENT_OTHER)
Admission: RE | Admit: 2020-01-13 | Discharge: 2020-01-13 | Disposition: A | Discharge status: Home / Self Care | Payer: BC Managed Care – PPO | Source: Ambulatory Visit | Attending: Family | Admitting: Family

## 2020-01-13 ENCOUNTER — Other Ambulatory Visit: Payer: Self-pay

## 2020-01-13 VITALS — BP 147/79 | HR 55 | Temp 98.4°F | Resp 16 | Ht 69.0 in | Wt 155.0 lb

## 2020-01-13 DIAGNOSIS — Z01419 Encounter for gynecological examination (general) (routine) without abnormal findings: Secondary | ICD-10-CM

## 2020-01-13 DIAGNOSIS — Z0000 Encounter for general adult medical examination without abnormal findings: Secondary | ICD-10-CM

## 2020-01-13 DIAGNOSIS — R87610 Atypical squamous cells of undetermined significance on cytologic smear of cervix (ASC-US): Secondary | ICD-10-CM | POA: Diagnosis not present

## 2020-01-13 DIAGNOSIS — Z1231 Encounter for screening mammogram for malignant neoplasm of breast: Secondary | ICD-10-CM | POA: Diagnosis not present

## 2020-01-13 DIAGNOSIS — I10 Essential (primary) hypertension: Secondary | ICD-10-CM | POA: Diagnosis not present

## 2020-01-13 MED ORDER — AMLODIPINE BESYLATE 5 MG PO TABS: 5.0000 mg | ORAL_TABLET | Freq: Every day | ORAL | 1 refills | Status: DC

## 2020-01-13 NOTE — Addendum Note (Signed)
Addended by: Jiles Prows on: 01/13/2020 09:57 AM   Modules accepted: Orders

## 2020-01-13 NOTE — Patient Instructions (Addendum)
Please increase amlodipine back up to 5 mg.   Continue to monitor your blood pressure at home.  Goal BP is 130/80 or less. Let me know if you are consistently running higher pressures.  Preventive Care 6-46 Years Old, Female Preventive care refers to visits with your health care provider and lifestyle choices that can promote health and wellness. This includes:  A yearly physical exam. This may also be called an annual well check.  Regular dental visits and eye exams.  Immunizations.  Screening for certain conditions.  Healthy lifestyle choices, such as eating a healthy diet, getting regular exercise, not using drugs or products that contain nicotine and tobacco, and limiting alcohol use. What can I expect for my preventive care visit? Physical exam Your health care provider will check your:  Height and weight. This may be used to calculate body mass index (BMI), which tells if you are at a healthy weight.  Heart rate and blood pressure.  Skin for abnormal spots. Counseling Your health care provider may ask you questions about your:  Alcohol, tobacco, and drug use.  Emotional well-being.  Home and relationship well-being.  Sexual activity.  Eating habits.  Work and work Statistician.  Method of birth control.  Menstrual cycle.  Pregnancy history. What immunizations do I need?  Influenza (flu) vaccine  This is recommended every year. Tetanus, diphtheria, and pertussis (Tdap) vaccine  You may need a Td booster every 10 years. Varicella (chickenpox) vaccine  You may need this if you have not been vaccinated. Zoster (shingles) vaccine  You may need this after age 75. Measles, mumps, and rubella (MMR) vaccine  You may need at least one dose of MMR if you were born in 1957 or later. You may also need a second dose. Pneumococcal conjugate (PCV13) vaccine  You may need this if you have certain conditions and were not previously vaccinated. Pneumococcal  polysaccharide (PPSV23) vaccine  You may need one or two doses if you smoke cigarettes or if you have certain conditions. Meningococcal conjugate (MenACWY) vaccine  You may need this if you have certain conditions. Hepatitis A vaccine  You may need this if you have certain conditions or if you travel or work in places where you may be exposed to hepatitis A. Hepatitis B vaccine  You may need this if you have certain conditions or if you travel or work in places where you may be exposed to hepatitis B. Haemophilus influenzae type b (Hib) vaccine  You may need this if you have certain conditions. Human papillomavirus (HPV) vaccine  If recommended by your health care provider, you may need three doses over 6 months. You may receive vaccines as individual doses or as more than one vaccine together in one shot (combination vaccines). Talk with your health care provider about the risks and benefits of combination vaccines. What tests do I need? Blood tests  Lipid and cholesterol levels. These may be checked every 5 years, or more frequently if you are over 74 years old.  Hepatitis C test.  Hepatitis B test. Screening  Lung cancer screening. You may have this screening every year starting at age 13 if you have a 30-pack-year history of smoking and currently smoke or have quit within the past 15 years.  Colorectal cancer screening. All adults should have this screening starting at age 54 and continuing until age 59. Your health care provider may recommend screening at age 17 if you are at increased risk. You will have tests every 1-10  years, depending on your results and the type of screening test.  Diabetes screening. This is done by checking your blood sugar (glucose) after you have not eaten for a while (fasting). You may have this done every 1-3 years.  Mammogram. This may be done every 1-2 years. Talk with your health care provider about when you should start having regular  mammograms. This may depend on whether you have a family history of breast cancer.  BRCA-related cancer screening. This may be done if you have a family history of breast, ovarian, tubal, or peritoneal cancers.  Pelvic exam and Pap test. This may be done every 3 years starting at age 46. Starting at age 47, this may be done every 5 years if you have a Pap test in combination with an HPV test. Other tests  Sexually transmitted disease (STD) testing.  Bone density scan. This is done to screen for osteoporosis. You may have this scan if you are at high risk for osteoporosis. Follow these instructions at home: Eating and drinking  Eat a diet that includes fresh fruits and vegetables, whole grains, lean protein, and low-fat dairy.  Take vitamin and mineral supplements as recommended by your health care provider.  Do not drink alcohol if: ? Your health care provider tells you not to drink. ? You are pregnant, may be pregnant, or are planning to become pregnant.  If you drink alcohol: ? Limit how much you have to 0-1 drink a day. ? Be aware of how much alcohol is in your drink. In the [place] one drink equals one 12 oz bottle of beer (355 mL), one 5 oz glass of wine (148 mL), or one 1 oz glass of hard liquor (44 mL). Lifestyle  Take daily care of your teeth and gums.  Stay active. Exercise for at least 30 minutes on 5 or more days each week.  Do not use any products that contain nicotine or tobacco, such as cigarettes, e-cigarettes, and chewing tobacco. If you need help quitting, ask your health care provider.  If you are sexually active, practice safe sex. Use a condom or other form of birth control (contraception) in order to prevent pregnancy and STIs (sexually transmitted infections).  If told by your health care provider, take low-dose aspirin daily starting at age 75. What's next?  Visit your health care provider once a year for a well check visit.  Ask your health care provider  how often you should have your eyes and teeth checked.  Stay up to date on all vaccines. This information is not intended to replace advice given to you by your health care provider. Make sure you discuss any questions you have with your health care provider. Document Revised: 01/07/2018 Document Reviewed: 01/07/2018 Elsevier Patient Education  2020 [employer].

## 2020-01-13 NOTE — Progress Notes (Signed)
Subjective:    Patient ID: Marissa Levine, female    DOB: [birth year], 44 y.o.   MRN: [id number]  HPI  Patient presents today for complete physical.  Immunizations:  tdap 2015, pfizer completed Diet: healthy Exercise: runs/weight lifting Pap Smear: due Mammogram: 2018 Wt Readings from Last 3 Encounters:  01/13/20 155 lb (70.3 kg)  12/02/19 150 lb 9.6 oz (68.3 kg)  01/18/19 145 lb 3.2 oz (65.9 kg)   HTN- reports home bp readings have been very "variable." Last visit bp looked great and we decreased her amlodipine from 5mg  to 2.5 mg once daily.  BP Readings from Last 3 Encounters:  01/13/20 (!) 147/79  12/02/19 108/65  01/18/19 111/66       Review of Systems  Constitutional: Negative for unexpected weight change.  HENT: Negative for hearing loss and rhinorrhea.   Eyes: Negative for visual disturbance.  Respiratory: Negative for cough and shortness of breath.   Cardiovascular: Negative for chest pain.  Gastrointestinal: Negative for constipation and diarrhea.  Genitourinary: Negative for dysuria, frequency and hematuria.  Musculoskeletal: Negative for arthralgias and myalgias.  Skin: Negative for rash.  Neurological: Negative for headaches.  Hematological: Negative for adenopathy.  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[employer]  Tobacco Use   Smoking status: Never Smoker   Smokeless tobacco: Never Used  Vaping Use   Vaping Use: Never used  Substance and Sexual Activity   Alcohol use: Yes    Alcohol/week: 1.0 standard drink    Types: 1 Glasses of wine per week    Comment: glass wine with diner   Drug  use: No   Sexual activity: Yes    Partners: Male    Birth control/protection: None    Comment: Husband had vasectomy  Other Topics Concern   Not on file  Social History Narrative   Two children   Married   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)   Enjoys running    Dog and cat   Completed a Oceanographer in [place] Strain:    Difficulty of Paying Living Expenses: Not on file  Food Insecurity:    Worried About Charity fundraiser in the Last Year: Not on file   [employer] of Food in the Last Year: Not on file  Transportation Needs:    Lack of Transportation (Medical): Not on file   Lack of Transportation (Non-Medical): Not on file  Physical Activity:    Days of Exercise per Week: Not on file   Minutes of Exercise per Session: Not on file  Stress:    Feeling of Stress : Not on file  Social Connections:    Frequency of Communication with Friends and Family: Not on file   Frequency of Social Gatherings with Friends and Family: Not on file   Attends Religious Services: Not on file   Active Member of Clubs or Organizations: Not on file   Attends Archivist Meetings: Not on file   Marital Status: Not on file  Intimate Partner Violence:    Fear of Current or Ex-Partner: Not on file   Emotionally Abused: Not on file   Physically Abused: Not on file   Sexually Abused: Not on file    No past surgical history on file.  Family History  Problem Relation Age of Onset   Cancer Mother 63       breast   Hypertension Mother    Cancer Father        basal cell carcinoma   Hypertension Father    Hypertension Maternal Grandmother    Hypertension Maternal Grandfather    Hypertension Paternal Grandmother    Hypertension Paternal Grandfather    Heart attack Paternal Grandfather     No Known Allergies  Current Outpatient Medications on File Prior to Visit  Medication Sig Dispense Refill    amLODipine (NORVASC) 5 MG tablet Take 0.5 tablets (2.5 mg total) by mouth daily. 45 tablet 1   metoprolol succinate (TOPROL-XL) 50 MG 24 hr tablet TAKE 1 TABLET DAILY WITH OR IMMEDIATELY FOLLOWING A MEAL (NEED FOLLOW UP FOR ASSESSMENT PRIOR TO ADDITIONAL REFILLS OR GREATER QUANTITY) 90 tablet 1   No current facility-administered medications on file prior to visit.    BP (!) 147/79 (BP Location: Right Arm, Patient Position: Sitting, Cuff Size: Small)    Pulse (!) 55    Temp 98.4 F (36.9 C) (Oral)    Resp 16    Ht 5\' 9"  (1.753 m)    Wt 155 lb (70.3 kg)    SpO2 100%    BMI 22.89 kg/m       Objective:   Physical Exam  Physical Exam  Constitutional: She is oriented to person, place, and time. She appears well-developed and well-nourished. No distress.  HENT:  Head: Normocephalic and atraumatic.  Right Ear: Tympanic membrane and ear canal normal.  Left Ear: Tympanic membrane and ear canal normal.  Mouth/Throat: not examined- pt wearing mask Eyes: Pupils are equal, round, and reactive to light. No scleral icterus.  Neck: Normal range of motion. No thyromegaly present.  Cardiovascular: Normal rate and regular rhythm.   No murmur heard. Pulmonary/Chest: Effort normal and breath sounds normal. No respiratory distress. He has no wheezes. She has no rales. She exhibits no tenderness.  Abdominal: Soft. Bowel sounds are normal. She exhibits no distension and no mass. There is no tenderness. There is no rebound and no guarding.  Musculoskeletal: She exhibits no edema.  Lymphadenopathy:    She has no cervical adenopathy.  Neurological: She is alert and oriented to person, place, and time. She has normal patellar reflexes. She exhibits normal muscle tone. Coordination normal.  Skin: Skin is warm and dry.  Psychiatric: She has a normal mood and affect. Her behavior is normal. Judgment and thought content normal.  Breasts: Examined lying Right: Without masses, retractions, discharge or axillary  adenopathy.  Left: Without masses, retractions, discharge or axillary adenopathy.  Inguinal/mons: Normal without inguinal adenopathy  External genitalia: Normal  BUS/Urethra/Skene's glands: Normal  Bladder: Normal  Vagina: Normal  Cervix: Normal  Uterus: normal in size, shape and contour. Midline and mobile  Adnexa/parametria:  Rt: Without masses or tenderness.  Lt: Without masses or tenderness.  Anus and perineum: Normal            Assessment & Plan:  Preventative care- encouraged pt to continue healthy diet and regular exercise.  Refer for mammo and obtain routine lab work.   HTN- bp was better on the amlodipine 5mg . Will increase amlodipine  back up to 5mg . Continue toprol at current dose. Pt is advised to continue to monitor her blood pressure at home.   This visit occurred during the SARS-CoV-2 public health emergency.  Safety protocols were in place, including screening questions prior to the visit, additional usage of staff PPE, and extensive cleaning of exam room while observing appropriate contact time as indicated for disinfecting solutions.         Assessment & Plan:

## 2020-01-14 LAB — CBC WITH DIFFERENTIAL/PLATELET
Absolute Monocytes: 442 cells/uL (ref 200–950)
Basophils Absolute: 19 cells/uL (ref 0–200)
Basophils Relative: 0.4 %
Eosinophils Absolute: 141 cells/uL (ref 15–500)
Eosinophils Relative: 3 %
HCT: 39.7 % (ref 35.0–45.0)
Hemoglobin: 13.8 g/dL (ref 11.7–15.5)
Lymphs Abs: 1589 cells/uL (ref 850–3900)
MCH: 31.9 pg (ref 27.0–33.0)
MCHC: 34.8 g/dL (ref 32.0–36.0)
MCV: 91.7 fL (ref 80.0–100.0)
MPV: 10.1 fL (ref 7.5–12.5)
Monocytes Relative: 9.4 %
Neutro Abs: 2510 cells/uL (ref 1500–7800)
Neutrophils Relative %: 53.4 %
Platelets: 223 10*3/uL (ref 140–400)
RBC: 4.33 10*6/uL (ref 3.80–5.10)
RDW: 12.4 % (ref 11.0–15.0)
Total Lymphocyte: 33.8 %
WBC: 4.7 10*3/uL (ref 3.8–10.8)

## 2020-01-14 LAB — LIPID PANEL
Cholesterol: 160 mg/dL (ref <200)
HDL: 63 mg/dL (ref >=50)
LDL Cholesterol (Calc): 85 mg/dL (calc)
Non-HDL Cholesterol (Calc): 97 mg/dL (calc) (ref <130)
Total CHOL/HDL Ratio: 2.5 (calc) (ref <5.0)
Triglycerides: 41 mg/dL (ref <150)

## 2020-01-14 LAB — COMPREHENSIVE METABOLIC PANEL
AG Ratio: 1.8 (calc) (ref 1.0–2.5)
ALT: 9 U/L (ref 6–29)
AST: 13 U/L (ref 10–35)
Albumin: 4.2 g/dL (ref 3.6–5.1)
Alkaline phosphatase (APISO): 44 U/L (ref 31–125)
BUN: 15 mg/dL (ref 7–25)
CO2: 24 mmol/L (ref 20–32)
Calcium: 8.8 mg/dL (ref 8.6–10.2)
Chloride: 106 mmol/L (ref 98–110)
Creat: 0.83 mg/dL (ref 0.50–1.10)
Globulin: 2.4 g/dL (calc) (ref 1.9–3.7)
Glucose, Bld: 92 mg/dL (ref 65–99)
Potassium: 4.3 mmol/L (ref 3.5–5.3)
Sodium: 138 mmol/L (ref 135–146)
Total Bilirubin: 0.6 mg/dL (ref 0.2–1.2)
Total Protein: 6.6 g/dL (ref 6.1–8.1)

## 2020-01-14 LAB — TSH: TSH: 1.39 mIU/L

## 2020-01-18 ENCOUNTER — Encounter: Payer: Self-pay | Admitting: Family

## 2020-01-18 LAB — CYTOLOGY - PAP
Comment: NEGATIVE
Diagnosis: UNDETERMINED — AB
High risk HPV: NEGATIVE

## 2020-01-19 ENCOUNTER — Telehealth: Payer: Self-pay | Admitting: Family

## 2020-01-19 NOTE — Telephone Encounter (Signed)
See mychart.  

## 2020-02-20 ENCOUNTER — Other Ambulatory Visit: Payer: Self-pay

## 2020-02-20 ENCOUNTER — Encounter: Payer: Self-pay | Admitting: Family

## 2020-02-20 DIAGNOSIS — I10 Essential (primary) hypertension: Secondary | ICD-10-CM

## 2020-02-20 MED ORDER — AMLODIPINE BESYLATE 5 MG PO TABS: 5.0000 mg | ORAL_TABLET | Freq: Every day | ORAL | 1 refills | Status: DC

## 2020-05-21 ENCOUNTER — Other Ambulatory Visit: Payer: Self-pay | Admitting: Family

## 2020-05-30 ENCOUNTER — Other Ambulatory Visit: Payer: Self-pay | Admitting: Family

## 2020-05-30 DIAGNOSIS — I10 Essential (primary) hypertension: Secondary | ICD-10-CM

## 2020-07-13 ENCOUNTER — Ambulatory Visit: Payer: BC Managed Care – PPO | Admitting: Family

## 2020-08-22 ENCOUNTER — Encounter: Payer: Self-pay | Admitting: Family

## 2020-08-22 ENCOUNTER — Other Ambulatory Visit: Payer: Self-pay

## 2020-08-22 DIAGNOSIS — I10 Essential (primary) hypertension: Secondary | ICD-10-CM

## 2020-08-22 MED ORDER — AMLODIPINE BESYLATE 5 MG PO TABS: 1.0000 | ORAL_TABLET | Freq: Every day | ORAL | 3 refills | Status: DC

## 2020-08-22 NOTE — Telephone Encounter (Signed)
[facility] as [facility] 5 mg daily #90

## 2020-09-03 ENCOUNTER — Ambulatory Visit: Payer: BC Managed Care – PPO | Admitting: Family

## 2021-01-24 ENCOUNTER — Other Ambulatory Visit (HOSPITAL_BASED_OUTPATIENT_CLINIC_OR_DEPARTMENT_OTHER): Payer: Self-pay | Admitting: Family

## 2021-01-24 DIAGNOSIS — Z1231 Encounter for screening mammogram for malignant neoplasm of breast: Secondary | ICD-10-CM

## 2021-01-25 ENCOUNTER — Encounter: Payer: Self-pay | Admitting: Family

## 2021-01-30 ENCOUNTER — Ambulatory Visit: Payer: BC Managed Care – PPO | Admitting: Family

## 2021-02-01 ENCOUNTER — Ambulatory Visit: Payer: BC Managed Care – PPO | Admitting: Family

## 2021-02-01 ENCOUNTER — Other Ambulatory Visit (HOSPITAL_COMMUNITY)
Admission: RE | Admit: 2021-02-01 | Discharge: 2021-02-01 | Disposition: A | Discharge status: Home / Self Care | Payer: BC Managed Care – PPO | Source: Ambulatory Visit | Attending: Family | Admitting: Family

## 2021-02-01 ENCOUNTER — Encounter: Payer: Self-pay | Admitting: Family

## 2021-02-01 ENCOUNTER — Other Ambulatory Visit: Payer: Self-pay

## 2021-02-01 VITALS — BP 133/77 | HR 57 | Temp 98.6°F | Resp 16 | Ht 69.0 in | Wt 140.0 lb

## 2021-02-01 DIAGNOSIS — G43909 Migraine, unspecified, not intractable, without status migrainosus: Secondary | ICD-10-CM

## 2021-02-01 DIAGNOSIS — Z01419 Encounter for gynecological examination (general) (routine) without abnormal findings: Secondary | ICD-10-CM | POA: Insufficient documentation

## 2021-02-01 DIAGNOSIS — I10 Essential (primary) hypertension: Secondary | ICD-10-CM | POA: Diagnosis not present

## 2021-02-01 DIAGNOSIS — Z0000 Encounter for general adult medical examination without abnormal findings: Secondary | ICD-10-CM | POA: Diagnosis not present

## 2021-02-01 DIAGNOSIS — Z1211 Encounter for screening for malignant neoplasm of colon: Secondary | ICD-10-CM

## 2021-02-01 NOTE — Assessment & Plan Note (Signed)
BP Readings from Last 3 Encounters:  02/01/21 133/77  01/13/20 (!) 147/79  12/02/19 108/65   BP stable. Continue current dose of amlodipine 5mg  and toprol xl 50mg .

## 2021-02-01 NOTE — Patient Instructions (Signed)
Continue your work with healthy diet and regular exercise.   Please consider flu shot and covid bivalent booster at the pharmacy.

## 2021-02-01 NOTE — Addendum Note (Signed)
Addended by: Jiles Prows on: 02/01/2021 09:14 AM   Modules accepted: Orders

## 2021-02-01 NOTE — Assessment & Plan Note (Signed)
No migraine in "years."  Monitor.

## 2021-02-01 NOTE — Progress Notes (Signed)
Subjective:   By signing my name below, I, Lyric Barr-McArthur, attest that this documentation has been prepared under the direction and in the presence of Debbrah Alar, NP, 02/01/2021   Patient ID: Marissa Levine, female    DOB: [birth year], 45 y.o.   MRN: [id number]  Chief Complaint  Patient presents with   Annual Exam    HPI Patient is in today for a comprehensive physical exam.  She denies having any unexpected weight change, ear pain, hearing loss and rhinorrhea, visual disturbance, cough, chest pain and leg swelling, nausea, vomiting, diarrhea and blood in stool, or dysuria and frequency, for myalgias and arthralgias, headaches, adenopathy, depression or anxiety at this time. Skin rashes: She has a few areas of concern on her skin, but has scheduled a dermatology appointment for these concerns.  Migraines: She notes that her migraines have completely gone away in the last few years.  Depression/Anxiety: She denies any feelings of depression or anxiety at this time.  Immunizations: She has received 3 Pfizer vaccinations. She is interested in receiving the updated Covid-19 booster. She does not want to receive her flu shot at this time.  Diet: She is currently partaking in [employer] and has been compliant with a healthy diet.  Bowel Movement: She has noticed a decreased change in her bowel movements and has noted a increase in gas since she began her diet.  Exercise: She partakes in 2 hours of exercise per day. She usually walks but does have a weight trainer.  Colonoscopy: She has never had a colonoscopy but is willing to receive one due to recommendation. Pap Smear: Last performed on 01/13/2020 and results showed atypical squamous cells of undetermined significance. She is having an updated pap smear in the office today.  Mammogram: Last taken on 01/13/2020 and results were normal. She has another mammogram scheduled for 02/18/2021. Vision: She is UTD on vision.  Dental:  She is UTD on dental.  Shx: She notes no changes in her surgical history since last year.  FMHx: She notes no changes in her family medical history. Alcohol: She drinks a glass of wine per week. Drug use: Never used.  Tobacco: Never used.    Health Maintenance Due  Topic Date Due   HIV Screening  Never done   Hepatitis C Screening  Never done   COLONOSCOPY (Pts 45-18yrs Insurance coverage will need to be confirmed)  Never done   MAMMOGRAM  01/12/2021    Past Medical History:  Diagnosis Date   Migraine     History reviewed. No pertinent surgical history.  Family History  Problem Relation Age of Onset   Cancer Mother 52       breast   Hypertension Mother    Cancer Father        basal cell carcinoma   Hypertension Father    Hypertension Maternal Grandmother    Hypertension Maternal Grandfather    Hypertension Paternal Grandmother    Hypertension Paternal Grandfather    Heart attack Paternal Grandfather     Social History   Socioeconomic History   Marital status: Married    Spouse name: Glendell Docker   Number of children: 2   Years of education: 18   Highest education level: Bachelor's degree (e.g., BA, AB, BS)  Occupational History   Occupation: Acupuncturist: [employer]  Tobacco Use   Smoking status: Never   Smokeless tobacco: Never  Vaping Use   Vaping Use: Never used  Substance  and Sexual Activity   Alcohol use: Yes    Alcohol/week: 1.0 standard drink    Types: 1 Glasses of wine per week    Comment: n   Drug use: No   Sexual activity: Yes    Partners: Male    Birth control/protection: None    Comment: Husband had vasectomy  Other Topics Concern   Not on file  Social History Narrative   Two children   Married   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 90 tablet 3   metoprolol succinate (TOPROL-XL) 50 MG 24 hr tablet TAKE 1 TABLET DAILY WITH OR IMMEDIATELY FOLLOWING A MEAL (NEED FOLLOW UP FOR ASSESSMENT PRIOR TO ADDITIONAL REFILLS OR GREATER QUANTITY) 90 tablet 3   No facility-administered medications prior to visit.    No Known Allergies  Review of Systems  Constitutional:  Negative for fever.       (-) unexpected weight changes  HENT:  Negative for ear pain and hearing loss.        (-) rhinorrhea   Eyes:        (-) visual disruptions  Respiratory:  Negative for cough.   Cardiovascular:  Negative for chest pain and leg swelling.  Gastrointestinal:  Negative for blood in stool, constipation, diarrhea, nausea and vomiting.  Genitourinary:  Negative for dysuria and frequency.  Musculoskeletal:  Negative for joint pain and myalgias.  Neurological:  Negative for headaches.  Psychiatric/Behavioral:  Negative for depression. The patient is not nervous/anxious.       Objective:    Physical Exam Exam conducted with a chaperone present.  Constitutional:      General: She is not in acute distress.    Appearance: Normal appearance. She is not ill-appearing.  HENT:     Head: Normocephalic and atraumatic.     Right Ear: External ear normal.     Left Ear: External ear normal.  Eyes:     Extraocular Movements: Extraocular movements intact.     Pupils: Pupils are equal, round, and reactive to light.     Comments: (-) nystagmus  Cardiovascular:     Rate and Rhythm: Normal rate and regular rhythm.     Heart sounds: Normal heart sounds. No murmur heard.   No gallop.  Pulmonary:     Effort: Pulmonary effort is normal. No  respiratory distress.     Breath sounds: Normal breath sounds. No wheezing or rales.  Chest:  Breasts:    Right: Normal. No mass.     Left: Normal. No mass.  Abdominal:     General: Bowel sounds are normal.     Tenderness: There is no abdominal tenderness.  Genitourinary:    General: Normal vulva.     Labia:        Right: No rash.        Left: No rash.      Vagina: Normal.     Cervix: No cervical  motion tenderness.     Uterus: Normal.      Adnexa: Right adnexa normal and left adnexa normal.       Right: No mass or tenderness.         Left: No mass or tenderness.    Lymphadenopathy:     Cervical: No cervical adenopathy.  Skin:    General: Skin is warm and dry.  Neurological:     Mental Status: She is alert and oriented to person, place, and time.     Deep Tendon Reflexes:     Reflex Scores:      Patellar reflexes are 2+ on the right side and 2+ on the left side. Psychiatric:        Behavior: Behavior normal.        Judgment: Judgment normal.    BP 133/77 (BP Location: Right Arm, Patient Position: Sitting, Cuff Size: Small)   Pulse (!) 57   Temp 98.6 F (37 C) (Oral)   Resp 16   Ht 5\' 9"  (1.753 m)   Wt 140 lb (63.5 kg)   SpO2 100%   BMI 20.67 kg/m  Wt Readings from Last 3 Encounters:  02/01/21 140 lb (63.5 kg)  01/13/20 155 lb (70.3 kg)  12/02/19 150 lb 9.6 oz (68.3 kg)       Assessment & Plan:   Problem List Items Addressed This Visit       Unprioritized   Preventative health care - Primary    Wt Readings from Last 3 Encounters:  02/01/21 140 lb (63.5 kg)  01/13/20 155 lb (70.3 kg)  12/02/19 150 lb 9.6 oz (68.3 kg)  Continue healthy diet and regular exercise.       Migraines    No migraine in "years."  Monitor.       Hypertension    BP Readings from Last 3 Encounters:  02/01/21 133/77  01/13/20 (!) 147/79  12/02/19 108/65  BP stable. Continue current dose of amlodipine 5mg  and toprol xl 50mg .       Other Visit Diagnoses     Colon cancer  screening       Relevant Orders   Ambulatory referral to Gastroenterology      No orders of the defined types were placed in this encounter.   I, Debbrah Alar, NP, personally preformed the services described in this documentation.  All medical record entries made by the scribe were at my direction and in my presence.  I have reviewed the chart and discharge instructions (if applicable) and agree that the record reflects my personal performance and is accurate and complete. 02/01/2021  I,Lyric Barr-McArthur,acting as a scribe for Nance Pear, NP.,have documented all relevant documentation on the behalf of Nance Pear, NP,as directed by  Nance Pear, NP while in the presence of Nance Pear, NP.  Nance Pear, NP

## 2021-02-01 NOTE — Assessment & Plan Note (Signed)
Wt Readings from Last 3 Encounters:  02/01/21 140 lb (63.5 kg)  01/13/20 155 lb (70.3 kg)  12/02/19 150 lb 9.6 oz (68.3 kg)   Continue healthy diet and regular exercise.

## 2021-02-05 LAB — CYTOLOGY - PAP
Comment: NEGATIVE
Diagnosis: NEGATIVE
High risk HPV: NEGATIVE

## 2021-02-18 ENCOUNTER — Encounter: Payer: Self-pay | Admitting: Gastroenterology

## 2021-02-18 ENCOUNTER — Ambulatory Visit (HOSPITAL_BASED_OUTPATIENT_CLINIC_OR_DEPARTMENT_OTHER)
Admission: RE | Admit: 2021-02-18 | Discharge: 2021-02-18 | Disposition: A | Discharge status: Home / Self Care | Payer: BC Managed Care – PPO | Source: Ambulatory Visit | Attending: Family | Admitting: Family

## 2021-02-18 ENCOUNTER — Encounter (HOSPITAL_BASED_OUTPATIENT_CLINIC_OR_DEPARTMENT_OTHER): Payer: Self-pay

## 2021-02-18 ENCOUNTER — Other Ambulatory Visit: Payer: Self-pay

## 2021-02-18 DIAGNOSIS — Z1231 Encounter for screening mammogram for malignant neoplasm of breast: Secondary | ICD-10-CM | POA: Insufficient documentation

## 2021-03-15 ENCOUNTER — Other Ambulatory Visit: Payer: Self-pay

## 2021-03-15 ENCOUNTER — Encounter: Payer: Self-pay | Admitting: Gastroenterology

## 2021-03-15 ENCOUNTER — Ambulatory Visit (AMBULATORY_SURGERY_CENTER): Payer: BC Managed Care – PPO

## 2021-03-15 VITALS — Ht 69.0 in | Wt 140.0 lb

## 2021-03-15 DIAGNOSIS — Z1211 Encounter for screening for malignant neoplasm of colon: Secondary | ICD-10-CM

## 2021-03-15 MED ORDER — PLENVU 140 G PO SOLR: 1.0000 | ORAL | 0 refills | Status: DC

## 2021-03-15 NOTE — Progress Notes (Signed)
Pre visit completed via phone call; Patient verified name, DOB, and address; No egg or soy allergy known to patient  No issues known to pt with past sedation with any surgeries or procedures Patient denies ever being told they had issues or difficulty with intubation  No FH of Malignant Hyperthermia Pt is not on diet pills Pt is not on home 02  Pt is not on blood thinners  Pt denies issues with constipation at this time;  No A fib or A flutter Pt is fully vaccinated for Covid x 2 + booster; Coupon given to pt in PV today, Code to Pharmacy and NO PA's for preps discussed with pt in PV today  Discussed with pt there will be an out-of-pocket cost for prep and that varies from $0 to 70 + dollars - pt verbalized understanding  Due to the COVID-19 pandemic we are asking patients to follow certain guidelines in PV and the [facility]   Pt aware of COVID protocols and [facility] guidelines

## 2021-03-21 DIAGNOSIS — C44712 Basal cell carcinoma of skin of right lower limb, including hip: Secondary | ICD-10-CM | POA: Diagnosis not present

## 2021-03-21 DIAGNOSIS — C44519 Basal cell carcinoma of skin of other part of trunk: Secondary | ICD-10-CM | POA: Diagnosis not present

## 2021-03-21 DIAGNOSIS — Z23 Encounter for immunization: Secondary | ICD-10-CM | POA: Diagnosis not present

## 2021-03-21 DIAGNOSIS — L821 Other seborrheic keratosis: Secondary | ICD-10-CM | POA: Diagnosis not present

## 2021-03-21 DIAGNOSIS — C44311 Basal cell carcinoma of skin of nose: Secondary | ICD-10-CM | POA: Diagnosis not present

## 2021-03-21 DIAGNOSIS — D485 Neoplasm of uncertain behavior of skin: Secondary | ICD-10-CM | POA: Diagnosis not present

## 2021-04-11 ENCOUNTER — Encounter: Payer: BC Managed Care – PPO | Admitting: Gastroenterology

## 2021-04-19 ENCOUNTER — Encounter: Payer: BC Managed Care – PPO | Admitting: Gastroenterology

## 2021-04-24 DIAGNOSIS — D1801 Hemangioma of skin and subcutaneous tissue: Secondary | ICD-10-CM | POA: Diagnosis not present

## 2021-04-24 DIAGNOSIS — L570 Actinic keratosis: Secondary | ICD-10-CM | POA: Diagnosis not present

## 2021-04-24 DIAGNOSIS — C44519 Basal cell carcinoma of skin of other part of trunk: Secondary | ICD-10-CM | POA: Diagnosis not present

## 2021-04-24 DIAGNOSIS — L578 Other skin changes due to chronic exposure to nonionizing radiation: Secondary | ICD-10-CM | POA: Diagnosis not present

## 2021-04-24 DIAGNOSIS — L821 Other seborrheic keratosis: Secondary | ICD-10-CM | POA: Diagnosis not present

## 2021-04-24 DIAGNOSIS — D485 Neoplasm of uncertain behavior of skin: Secondary | ICD-10-CM | POA: Diagnosis not present

## 2021-05-16 ENCOUNTER — Other Ambulatory Visit: Payer: Self-pay | Admitting: Family

## 2021-05-17 ENCOUNTER — Encounter: Payer: Self-pay | Admitting: Family

## 2021-05-17 ENCOUNTER — Other Ambulatory Visit: Payer: Self-pay

## 2021-05-17 MED ORDER — METOPROLOL SUCCINATE ER 50 MG PO TB24: 50.0000 mg | ORAL_TABLET | Freq: Every day | ORAL | 1 refills | Status: DC

## 2021-05-29 ENCOUNTER — Other Ambulatory Visit: Payer: Self-pay

## 2021-05-29 ENCOUNTER — Encounter: Payer: Self-pay | Admitting: Gastroenterology

## 2021-05-29 ENCOUNTER — Ambulatory Visit (AMBULATORY_SURGERY_CENTER): Payer: BC Managed Care – PPO | Admitting: Gastroenterology

## 2021-05-29 VITALS — BP 97/55 | HR 61 | Temp 98.0°F | Resp 14 | Ht 69.0 in | Wt 140.0 lb

## 2021-05-29 DIAGNOSIS — Z1211 Encounter for screening for malignant neoplasm of colon: Secondary | ICD-10-CM

## 2021-05-29 DIAGNOSIS — D125 Benign neoplasm of sigmoid colon: Secondary | ICD-10-CM

## 2021-05-29 DIAGNOSIS — D122 Benign neoplasm of ascending colon: Secondary | ICD-10-CM

## 2021-05-29 DIAGNOSIS — K641 Second degree hemorrhoids: Secondary | ICD-10-CM

## 2021-05-29 MED ORDER — SODIUM CHLORIDE 0.9 % IV SOLN: 500.0000 mL | Freq: Once | INTRAVENOUS | Status: DC

## 2021-05-29 NOTE — Progress Notes (Signed)
Pt non-responsive, VVS, Report to RN  °

## 2021-05-29 NOTE — Patient Instructions (Signed)
Please read handouts provided. Continue present medications. Await pathology results.   YOU HAD AN ENDOSCOPIC PROCEDURE TODAY AT THE [facility]:   Refer to the procedure report that was given to you for any specific questions about what was found during the examination.  If the procedure report does not answer your questions, please call your gastroenterologist to clarify.  If you requested that your care partner not be given the details of your procedure findings, then the procedure report has been included in a sealed envelope for you to review at your convenience later.  YOU SHOULD EXPECT: Some feelings of bloating in the abdomen. Passage of more gas than usual.  Walking can help get rid of the air that was put into your GI tract during the procedure and reduce the bloating. If you had a lower endoscopy (such as a colonoscopy or flexible sigmoidoscopy) you may notice spotting of blood in your stool or on the toilet paper. If you underwent a bowel prep for your procedure, you may not have a normal bowel movement for a few days.  Please Note:  You might notice some irritation and congestion in your nose or some drainage.  This is from the oxygen used during your procedure.  There is no need for concern and it should clear up in a day or so.  SYMPTOMS TO REPORT IMMEDIATELY:  Following lower endoscopy (colonoscopy or flexible sigmoidoscopy):  Excessive amounts of blood in the stool  Significant tenderness or worsening of abdominal pains  Swelling of the abdomen that is new, acute  Fever of 100F or higher   For urgent or emergent issues, a gastroenterologist can be reached at any hour by calling [phone]. Do not use MyChart messaging for urgent concerns.    DIET:  We do recommend a small meal at first, but then you may proceed to your regular diet.  Drink plenty of fluids but you should avoid alcoholic beverages for 24 hours.  ACTIVITY:  You should plan to take it easy  for the rest of today and you should NOT DRIVE or use heavy machinery until tomorrow (because of the sedation medicines used during the test).    FOLLOW UP: Our staff will call the number listed on your records 48-72 hours following your procedure to check on you and address any questions or concerns that you may have regarding the information given to you following your procedure. If we do not reach you, we will leave a message.  We will attempt to reach you two times.  During this call, we will ask if you have developed any symptoms of COVID 19. If you develop any symptoms (ie: fever, flu-like symptoms, shortness of breath, cough etc.) before then, please call [phone].  If you test positive for Covid 19 in the 2 weeks post procedure, please call and report this information to us.    If any biopsies were taken you will be contacted by phone or by letter within the next 1-3 weeks.  Please call us at [phone] if you have not heard about the biopsies in 3 weeks.    SIGNATURES/CONFIDENTIALITY: You and/or your care partner have signed paperwork which will be entered into your electronic medical record.  These signatures attest to the fact that that the information above on your After Visit Summary has been reviewed and is understood.  Full responsibility of the confidentiality of this discharge information lies with you and/or your care-partner.  

## 2021-05-29 NOTE — Progress Notes (Signed)
GASTROENTEROLOGY PROCEDURE H&P NOTE   Primary Care Physician: Debbrah Alar, NP    Reason for Procedure:  Colon Cancer screening  Plan:    Colonoscopy  Patient is appropriate for endoscopic procedure(s) in the ambulatory ([place]) setting.  The nature of the procedure, as well as the risks, benefits, and alternatives were carefully and thoroughly reviewed with the patient. Ample time for discussion and questions allowed. The patient understood, was satisfied, and agreed to proceed.     HPI: Marissa Levine is a 46 y.o. female who presents for colonoscopy for routine Colon Cancer screening.  No active GI symptoms.  No known family history of colon cancer or related malignancy.  Patient is otherwise without complaints or active issues today.  Past Medical History:  Diagnosis Date   Hypertension    on meds   Migraine    hx of    Past Surgical History:  Procedure Laterality Date   WISDOM TOOTH EXTRACTION      Prior to Admission medications   Medication Sig Start Date End Date Taking? Authorizing Provider  amLODipine (NORVASC) 5 MG tablet Take 1 tablet (5 mg total) by mouth daily. 08/22/20  Yes Debbrah Alar, NP  metoprolol succinate (TOPROL-XL) 50 MG 24 hr tablet Take 1 tablet (50 mg total) by mouth daily. Take with or immediately following a meal. 05/17/21  Yes Debbrah Alar, NP    Current Outpatient Medications  Medication Sig Dispense Refill   amLODipine (NORVASC) 5 MG tablet Take 1 tablet (5 mg total) by mouth daily. 90 tablet 3   metoprolol succinate (TOPROL-XL) 50 MG 24 hr tablet Take 1 tablet (50 mg total) by mouth daily. Take with or immediately following a meal. 90 tablet 1   Current Facility-Administered Medications  Medication Dose Route Frequency Provider Last Rate Last Admin   0.9 %  sodium chloride infusion  500 mL Intravenous Once Cleveland Yarbro V, DO        Allergies as of 05/29/2021   (No Known Allergies)    Family History  Problem  Relation Age of Onset   Cancer Mother 35       breast   Hypertension Mother    Cancer Father        basal cell carcinoma   Hypertension Father    Hypertension Maternal Grandmother    Hypertension Maternal Grandfather    Hypertension Paternal Grandmother    Hypertension Paternal Grandfather    Heart attack Paternal Grandfather    Colon polyps Neg Hx    Colon cancer Neg Hx    Esophageal cancer Neg Hx    Rectal cancer Neg Hx    Stomach cancer Neg Hx     Social History   Socioeconomic History   Marital status: Married    Spouse name: Glendell Docker   Number of children: 2   Years of education: 18   Highest education level: Bachelor's degree (e.g., BA, AB, BS)  Occupational History   Occupation: Acupuncturist: [employer]  Tobacco Use   Smoking status: Never   Smokeless tobacco: Never  Vaping Use   Vaping Use: Never used  Substance and Sexual Activity   Alcohol use: Yes    Alcohol/week: 7.0 standard drinks    Types: 7 Standard drinks or equivalent per week    Comment: n   Drug use: No   Sexual activity: Yes    Partners: Male    Birth control/protection: None    Comment: Husband had vasectomy  Other Topics Concern   Not on file  Social History Narrative   Two children   Married   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)   Enjoys running    Dog and cat   Completed a Oceanographer in [place] Strain: Not on file  Food Insecurity: Not on file  Transportation Needs: Not on file  Physical Activity: Not on file  Stress: Not on file  Social Connections: Not on file  Intimate Partner Violence: Not on file    Physical Exam: Vital signs in last 24 hours: @BP  138/87    Pulse 76    Temp 98 F (36.7 C)    Ht 5\' 9"  (1.753 m)    Wt 140 lb (63.5 kg)    LMP 05/19/2021    SpO2 97%    BMI 20.67 kg/m  GEN: NAD EYE: Sclerae anicteric ENT: MMM CV: Non-tachycardic Pulm: CTA b/l GI: Soft, NT/ND NEURO:  Alert &  Oriented x 3   Gerrit Heck, DO [facility]   05/29/2021 7:45 AM

## 2021-05-29 NOTE — Progress Notes (Signed)
Pt's states no medical or surgical changes since previsit or office visit. 

## 2021-05-29 NOTE — Progress Notes (Signed)
Called to room to assist during endoscopic procedure.  Patient ID and intended procedure confirmed with present staff. Received instructions for my participation in the procedure from the performing physician.  

## 2021-05-29 NOTE — Op Note (Signed)
[place] Patient Name: Marissa Levine Procedure Date: 05/29/2021 7:14 AM MRN: [id number] Endoscopist: Gerrit Heck , MD Age: 46 Referring MD:  Date of Birth: [birth year] Gender: Female Account #: [id number] Procedure:                Colonoscopy Indications:              Screening for colorectal malignant neoplasm, This                            is the patient's first colonoscopy Medicines:                Monitored Anesthesia Care Procedure:                Pre-Anesthesia Assessment:                           - Prior to the procedure, a History and Physical                            was performed, and patient medications and                            allergies were reviewed. The patient's tolerance of                            previous anesthesia was also reviewed. The risks                            and benefits of the procedure and the sedation                            options and risks were discussed with the patient.                            All questions were answered, and informed consent                            was obtained. Prior Anticoagulants: The patient has                            taken no previous anticoagulant or antiplatelet                            agents. ASA Grade Assessment: II - A patient with                            mild systemic disease. After reviewing the risks                            and benefits, the patient was deemed in                            satisfactory condition to undergo the procedure.  After obtaining informed consent, the colonoscope                            was passed under direct vision. Throughout the                            procedure, the patient's blood pressure, pulse, and                            oxygen saturations were monitored continuously. The                            Olympus Colonoscope #[id number] was introduced through                            the anus and advanced  to the the cecum, identified                            by appendiceal orifice and ileocecal valve. The                            colonoscopy was performed without difficulty. The                            patient tolerated the procedure well. The quality                            of the bowel preparation was good. The ileocecal                            valve, appendiceal orifice, and rectum were                            photographed. Scope In: 8:01:09 AM Scope Out: 8:19:06 AM Scope Withdrawal Time: 0 hours 10 minutes 49 seconds  Total Procedure Duration: 0 hours 17 minutes 57 seconds  Findings:                 Hemorrhoids were found on perianal exam.                           Two sessile polyps were found in the sigmoid colon                            and ascending colon. The polyps were 3 to 5 mm in                            size. These polyps were removed with a cold snare.                            Resection and retrieval were complete. Estimated                            blood loss was minimal.  Non-bleeding internal hemorrhoids were found during                            retroflexion. The hemorrhoids were small and Grade                            II (internal hemorrhoids that prolapse but reduce                            spontaneously). Complications:            No immediate complications. Estimated Blood Loss:     Estimated blood loss was minimal. Impression:               - Hemorrhoids found on perianal exam.                           - Two 3 to 5 mm polyps in the sigmoid colon and in                            the ascending colon, removed with a cold snare.                            Resected and retrieved.                           - Non-bleeding internal hemorrhoids. Recommendation:           - Patient has a contact number available for                            emergencies. The signs and symptoms of potential                             delayed complications were discussed with the                            patient. Return to normal activities tomorrow.                            Written discharge instructions were provided to the                            patient.                           - Resume previous diet.                           - Continue present medications.                           - Await pathology results.                           - Repeat colonoscopy for surveillance based on  pathology results.                           - Use fiber, for example Citrucel, Fibercon, Konsyl                            or Metamucil.                           - Internal hemorrhoids were noted on this study and                            may be amenable to hemorrhoid band ligation. If you                            are interested in further treatment of these                            hemorrhoids with band ligation, please contact my                            clinic to set up an appointment for evaluation and                            treatment. Gerrit Heck, MD 05/29/2021 8:24:11 AM

## 2021-05-31 ENCOUNTER — Telehealth: Payer: Self-pay | Admitting: *Deleted

## 2021-05-31 NOTE — Telephone Encounter (Signed)
°  Follow up Call-  Call back number 05/29/2021  Post procedure Call Back phone  # [phone]  Some recent data might be hidden    No answer at 2nd attempt follow up phone call.  Left message on voicemail.

## 2021-05-31 NOTE — Telephone Encounter (Signed)
No answer on first attempt follow-up call. Message left

## 2021-06-04 ENCOUNTER — Encounter: Payer: Self-pay | Admitting: Gastroenterology

## 2021-06-18 DIAGNOSIS — C44311 Basal cell carcinoma of skin of nose: Secondary | ICD-10-CM | POA: Diagnosis not present

## 2021-06-18 DIAGNOSIS — C44511 Basal cell carcinoma of skin of breast: Secondary | ICD-10-CM | POA: Diagnosis not present

## 2021-07-10 HISTORY — PX: MOHS SURGERY: SHX181

## 2021-07-30 ENCOUNTER — Other Ambulatory Visit: Payer: Self-pay | Admitting: Family

## 2021-07-30 DIAGNOSIS — I10 Essential (primary) hypertension: Secondary | ICD-10-CM

## 2021-08-02 ENCOUNTER — Encounter: Payer: Self-pay | Admitting: Family

## 2021-08-02 ENCOUNTER — Ambulatory Visit: Payer: BC Managed Care – PPO | Admitting: Family

## 2021-08-02 DIAGNOSIS — I10 Essential (primary) hypertension: Secondary | ICD-10-CM | POA: Diagnosis not present

## 2021-08-02 DIAGNOSIS — C4491 Basal cell carcinoma of skin, unspecified: Secondary | ICD-10-CM | POA: Diagnosis not present

## 2021-08-02 DIAGNOSIS — T8149XA Infection following a procedure, other surgical site, initial encounter: Secondary | ICD-10-CM | POA: Diagnosis not present

## 2021-08-02 HISTORY — DX: Infection following a procedure, other surgical site, initial encounter: T81.49XA

## 2021-08-02 LAB — BASIC METABOLIC PANEL
BUN: 19 mg/dL (ref 6–23)
CO2: 28 mEq/L (ref 19–32)
Calcium: 9 mg/dL (ref 8.4–10.5)
Chloride: 105 mEq/L (ref 96–112)
Creatinine, Ser: 0.83 mg/dL (ref 0.40–1.20)
GFR: 83.85 mL/min (ref >60.00)
Glucose, Bld: 86 mg/dL (ref 70–99)
Potassium: 4.1 mEq/L (ref 3.5–5.1)
Sodium: 140 mEq/L (ref 135–145)

## 2021-08-02 MED ORDER — CEPHALEXIN 500 MG PO CAPS: 500.0000 mg | ORAL_CAPSULE | Freq: Three times a day (TID) | ORAL | 0 refills | Status: DC

## 2021-08-02 NOTE — Assessment & Plan Note (Signed)
BP Readings from Last 3 Encounters:  ?08/02/21 108/80  ?05/29/21 (!) 97/55  ?02/01/21 133/77  ? ?Current BP medication includes amlodipine '5mg'$  and Toprol xl '50mg'$ .  ?

## 2021-08-02 NOTE — Assessment & Plan Note (Signed)
Following with dermatology. S/p Moh's left side of nose and left anterior chest.  ?

## 2021-08-02 NOTE — Progress Notes (Signed)
? ?Subjective:  ? ?By signing my name below, I, Carylon Perches, attest that this documentation has been prepared under the direction and in the presence of Debbrah Alar NP, 08/02/2021 ? ? Patient ID: Marissa Levine, female    DOB: 1975-12-04, 46 y.o.   MRN: [id number] ? ?Chief Complaint  ?Patient presents with  ? Medical Management of Chronic Issues  ?  6 m f/u   ? ? ?HPI ?Patient is in today for an office visit. ? ?Basal Cell carcinoma - She reports a blister that had arisen on her chest at the site of her Moh's surgery incision. Surgery was about 6 weeks ago.  ? ?Blood Pressure - She is continuing to take 5 MG of Amlodipine and 50 MG of Metoprolol Succinate. ?BP Readings from Last 3 Encounters:  ?08/02/21 108/80  ?05/29/21 (!) 97/55  ?02/01/21 133/77  ? ?COVID - 19 Bivalent Vaccine - She reports that she has received the COVID - 19 Bivalent vaccine.  ? ?HIV/Hepatitis C Screening - She declines the option to receive a HIV and Hepatitis C screening.  ? ?There are no preventive care reminders to display for this patient. ? ? ?Past Medical History:  ?Diagnosis Date  ? Hypertension   ? on meds  ? Migraine   ? hx of  ? ? ?Past Surgical History:  ?Procedure Laterality Date  ? MOHS SURGERY  07/2021  ? left side of nose and left anterior chest, done at [facility]  ? WISDOM TOOTH EXTRACTION    ? ? ?Family History  ?Problem Relation Age of Onset  ? Cancer Mother 30  ?     breast  ? Hypertension Mother   ? Cancer Father   ?     basal cell carcinoma  ? Hypertension Father   ? Hypertension Maternal Grandmother   ? Hypertension Maternal Grandfather   ? Hypertension Paternal Grandmother   ? Hypertension Paternal Grandfather   ? Heart attack Paternal Grandfather   ? Colon polyps Neg Hx   ? Colon cancer Neg Hx   ? Esophageal cancer Neg Hx   ? Rectal cancer Neg Hx   ? Stomach cancer Neg Hx   ? ? ?Social History  ? ?Socioeconomic History  ? Marital status: Married  ?  Spouse name: Glendell Docker  ? Number of children: 2  ? Years of education:  36  ? Highest education level: Bachelor's degree (e.g., BA, AB, BS)  ?Occupational History  ? Occupation: Research and development  ?  Employer: [employer]  ?Tobacco Use  ? Smoking status: Never  ? Smokeless tobacco: Never  ?Vaping Use  ? Vaping Use: Never used  ?Substance and Sexual Activity  ? Alcohol use: Yes  ?  Alcohol/week: 7.0 standard drinks  ?  Types: 7 Standard drinks or equivalent per week  ?  Comment: n  ? Drug use: No  ? Sexual activity: Yes  ?  Partners: Male  ?  Birth control/protection: None  ?  Comment: Husband had vasectomy  ?Other Topics Concern  ? Not on file  ?Social History Narrative  ? Two children  ? Married  ? 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 Enjoys running   ? Dog and cat  ? Completed a masters in [place]  ? ?Social Determinants of Health  ? ?Financial Resource Strain: Not on file  ?Food Insecurity: Not on file  ?Transportation Needs: Not on file  ?Physical Activity: Not on file  ?Stress: Not on file  ?  Social Connections: Not on file  ?Intimate Partner Violence: Not on file  ? ? ?Outpatient Medications Prior to Visit  ?Medication Sig Dispense Refill  ? amLODipine (NORVASC) 5 MG tablet TAKE 1 TABLET DAILY 90 tablet 1  ? metoprolol succinate (TOPROL-XL) 50 MG 24 hr tablet Take 1 tablet (50 mg total) by mouth daily. Take with or immediately following a meal. 90 tablet 1  ? ?No facility-administered medications prior to visit.  ? ? ?No Known Allergies ? ?Review of Systems  ?Skin:   ?     (+) Basal Cell Removal Blister  ? ?   ?Objective:  ?  ?Physical Exam ?Constitutional:   ?   General: She is not in acute distress. ?   Appearance: Normal appearance. She is not ill-appearing.  ?HENT:  ?   Head: Normocephalic and atraumatic.  ?   Right Ear: External ear normal.  ?   Left Ear: External ear normal.  ?Eyes:  ?   Extraocular Movements: Extraocular movements intact.  ?   Pupils: Pupils are equal, round, and reactive to light.  ?Cardiovascular:  ?   Rate and Rhythm: Normal rate and  regular rhythm.  ?   Heart sounds: Normal heart sounds. No murmur heard. ?  No gallop.  ?Pulmonary:  ?   Effort: Pulmonary effort is normal. No respiratory distress.  ?   Breath sounds: Normal breath sounds. No wheezing or rales.  ?Skin: ?   General: Skin is warm and dry.  ?   Comments: Well healed scar left side of nose. Vertical incision left anterior chest has approximately 0.5 cm area of dehiscence with clear drainage and mild surrounding erythema  ?Neurological:  ?   Mental Status: She is alert and oriented to person, place, and time.  ?Psychiatric:     ?   Mood and Affect: Mood normal.     ?   Behavior: Behavior normal.     ?   Judgment: Judgment normal.  ? ? ?BP 108/80   Pulse (!) 58   Temp 97.7 ?F (36.5 ?C) (Oral)   Ht '5\' 9"'$  (1.753 m)   Wt 146 lb 3.2 oz (66.3 kg)   LMP 07/26/2021   SpO2 99%   BMI 21.59 kg/m?  ?Wt Readings from Last 3 Encounters:  ?08/02/21 146 lb 3.2 oz (66.3 kg)  ?05/29/21 140 lb (63.5 kg)  ?03/15/21 140 lb (63.5 kg)  ? ? ?   ?Assessment & Plan:  ? ?Problem List Items Addressed This Visit   ? ?  ? Unprioritized  ? Surgical wound infection  ?  Mild. Will rx with keflex. Advised pt to keep an eye on the area and call if area does not close up in the next 1-2 weeks.  ?  ?  ? Relevant Medications  ? cephALEXin (KEFLEX) 500 MG capsule  ? Hypertension  ?  BP Readings from Last 3 Encounters:  ?08/02/21 108/80  ?05/29/21 (!) 97/55  ?02/01/21 133/77  ?Current BP medication includes amlodipine '5mg'$  and Toprol xl '50mg'$ .  ?  ?  ? Relevant Orders  ? Basic metabolic panel  ? Basal cell carcinoma  ?  Following with dermatology. S/p Moh's left side of nose and left anterior chest.  ?  ?  ? Relevant Medications  ? cephALEXin (KEFLEX) 500 MG capsule  ? ? ? ? ?Meds ordered this encounter  ?Medications  ? cephALEXin (KEFLEX) 500 MG capsule  ?  Sig: Take 1 capsule (500 mg total) by mouth 3 (three)  times daily.  ?  Dispense:  21 capsule  ?  Refill:  0  ?  Order Specific Question:   Supervising Provider  ?   Answer:   Penni Homans A [[id number]]  ? ? ?I, Nance Pear, NP, personally preformed the services described in this documentation.  All medical record entries made by the scribe were at my direction and in my presence.  I have reviewed the chart and discharge instructions (if applicable) and agree that the record reflects my personal performance and is accurate and complete. 08/02/2021 ? ? ?I,Amber Collins,acting as a Education administrator for [employer], NP.,have documented all relevant documentation on the behalf of Nance Pear, NP,as directed by  Nance Pear, NP while in the presence of Nance Pear, NP. ? ? ? ?Nance Pear, NP ? ?

## 2021-08-02 NOTE — Assessment & Plan Note (Signed)
Mild. Will rx with keflex. Advised pt to keep an eye on the area and call if area does not close up in the next 1-2 weeks.  ?

## 2021-08-02 NOTE — Patient Instructions (Signed)
Please start keflex.  Call if incision worsens or if it does not improve. ?

## 2021-10-03 DIAGNOSIS — C44519 Basal cell carcinoma of skin of other part of trunk: Secondary | ICD-10-CM | POA: Diagnosis not present

## 2021-10-23 DIAGNOSIS — L570 Actinic keratosis: Secondary | ICD-10-CM | POA: Diagnosis not present

## 2021-10-23 DIAGNOSIS — L578 Other skin changes due to chronic exposure to nonionizing radiation: Secondary | ICD-10-CM | POA: Diagnosis not present

## 2021-10-23 DIAGNOSIS — L814 Other melanin hyperpigmentation: Secondary | ICD-10-CM | POA: Diagnosis not present

## 2021-10-23 DIAGNOSIS — D1801 Hemangioma of skin and subcutaneous tissue: Secondary | ICD-10-CM | POA: Diagnosis not present

## 2021-10-23 DIAGNOSIS — D0372 Melanoma in situ of left lower limb, including hip: Secondary | ICD-10-CM | POA: Diagnosis not present

## 2021-10-23 DIAGNOSIS — D0362 Melanoma in situ of left upper limb, including shoulder: Secondary | ICD-10-CM | POA: Diagnosis not present

## 2021-10-23 DIAGNOSIS — D485 Neoplasm of uncertain behavior of skin: Secondary | ICD-10-CM | POA: Diagnosis not present

## 2021-10-23 DIAGNOSIS — L821 Other seborrheic keratosis: Secondary | ICD-10-CM | POA: Diagnosis not present

## 2021-10-31 DIAGNOSIS — D0362 Melanoma in situ of left upper limb, including shoulder: Secondary | ICD-10-CM | POA: Diagnosis not present

## 2021-11-12 ENCOUNTER — Other Ambulatory Visit: Payer: Self-pay | Admitting: Family

## 2021-12-02 DIAGNOSIS — D0372 Melanoma in situ of left lower limb, including hip: Secondary | ICD-10-CM | POA: Diagnosis not present

## 2021-12-02 DIAGNOSIS — L989 Disorder of the skin and subcutaneous tissue, unspecified: Secondary | ICD-10-CM | POA: Diagnosis not present

## 2021-12-19 DIAGNOSIS — C44712 Basal cell carcinoma of skin of right lower limb, including hip: Secondary | ICD-10-CM | POA: Diagnosis not present

## 2022-01-27 ENCOUNTER — Other Ambulatory Visit: Payer: Self-pay | Admitting: Family

## 2022-01-27 DIAGNOSIS — I10 Essential (primary) hypertension: Secondary | ICD-10-CM

## 2022-02-07 ENCOUNTER — Encounter: Payer: BC Managed Care – PPO | Admitting: Family

## 2022-02-18 ENCOUNTER — Ambulatory Visit (INDEPENDENT_AMBULATORY_CARE_PROVIDER_SITE_OTHER): Payer: BC Managed Care – PPO | Admitting: Family

## 2022-02-18 ENCOUNTER — Encounter: Payer: Self-pay | Admitting: Family

## 2022-02-18 VITALS — BP 134/78 | HR 57 | Temp 98.3°F | Resp 16 | Ht 68.0 in | Wt 149.0 lb

## 2022-02-18 DIAGNOSIS — Z0000 Encounter for general adult medical examination without abnormal findings: Secondary | ICD-10-CM | POA: Diagnosis not present

## 2022-02-18 DIAGNOSIS — I10 Essential (primary) hypertension: Secondary | ICD-10-CM | POA: Diagnosis not present

## 2022-02-18 DIAGNOSIS — Z1231 Encounter for screening mammogram for malignant neoplasm of breast: Secondary | ICD-10-CM

## 2022-02-18 NOTE — Progress Notes (Signed)
Subjective:   By signing my name below, I, Carylon Perches, attest that this documentation has been prepared under the direction and in the presence of Karie Chimera, NP 02/18/2022   Patient ID: Gordy Councilman, female    DOB: 1976/05/12, 46 y.o.   MRN: [id number]  Chief Complaint  Patient presents with  . Annual Exam    HPI Patient is in today for a comprehensive physical exam  Blood Pressure: As of today's visit, her blood pressure is normal. She is currently taking 5 Mg of Amlodipine and 50 Mg of Metoprolol Succinate BP Readings from Last 3 Encounters:  02/18/22 134/78  08/02/21 108/80  05/29/21 (!) 97/55   Pulse Readings from Last 3 Encounters:  02/18/22 (!) 57  08/02/21 (!) 58  05/29/21 61    She denies having any fever, hearing or vision symptoms, new muscle pain, joint pain , new moles, rashes, congestion, sinus pain, sore throat, palpations, cough, SOB ,wheezing,n/v/d constipation, blood in stool, dysuria, frequency, hematuria, depression, anxiety, headaches at this time  Social history: She reports no recent surgeries. She denies of any changes to her family medical history. Colonoscopy: Last completed on 05/29/2021 Pap Smear: Last completed on 02/01/2021 Mammogram: Last completed on 02/18/2021. She has not scheduled a mammogram yet Immunizations: She is not interested in receiving an influenza vaccine today due to potential side effects.  Diet: She is maintaining a healthy diet Exercise: She is exercising regularly. She enjoys running and weights for exercise.  Dental: She is UTD on dental exams Vision: She is UTD on vision exams    Health Maintenance Due  Topic Date Due  . COVID-19 Vaccine (5 - Pfizer risk series) 07/18/2021  . MAMMOGRAM  02/18/2022    Past Medical History:  Diagnosis Date  . Hypertension    on meds  . Migraine    hx of  . Surgical wound infection 08/02/2021    Past Surgical History:  Procedure Laterality Date  . MOHS SURGERY   07/2021   left side of nose and left anterior chest, done at [facility]  . WISDOM TOOTH EXTRACTION      Family History  Problem Relation Age of Onset  . Cancer Mother 61       breast  . Hypertension Mother   . Cancer Father        basal cell carcinoma  . Hypertension Father   . Hypertension Maternal Grandmother   . Hypertension Maternal Grandfather   . Hypertension Paternal Grandmother   . Hypertension Paternal Grandfather   . Heart attack Paternal Grandfather   . Colon polyps Neg Hx   . Colon cancer Neg Hx   . Esophageal cancer Neg Hx   . Rectal cancer Neg Hx   . Stomach cancer Neg Hx     Social History   Socioeconomic History  . Marital status: Married    Spouse name: Glendell Docker  . Number of children: 2  . Years of education: 28  . Highest education level: Bachelor's degree (e.g., BA, AB, BS)  Occupational History  . 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 Smoking status: Never  . Smokeless tobacco: Never  Vaping Use  . Vaping Use: Never used  Substance and Sexual Activity  . Alcohol use: Yes    Alcohol/week: 7.0 standard drinks of alcohol    Types: 7 Standard drinks or equivalent per week  . Drug use: No  . Sexual activity: Yes  Partners: Male    Birth control/protection: None    Comment: Husband had vasectomy  Other Topics Concern  . 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 Difficulty of Paying Living Expenses: Not hard at all  Food Insecurity: Unknown (05/19/2017)   Hunger Vital Sign   . Worried About Charity fundraiser in the Last Year: Patient refused   . Ran Out of Food in the Last Year: Patient refused  Transportation Needs: Unknown (05/19/2017)   PRAPARE  - Transportation   . Lack of Transportation (Medical): Patient refused   . Lack of Transportation (Non-Medical): Patient refused  Physical Activity: Sufficiently Active (05/19/2017)   Exercise Vital Sign   . Days of Exercise per Week: 7 days   . Minutes of Exercise per Session: 60 min  Stress: Stress Concern Present (05/19/2017)   [place]   . Feeling of Stress : Rather much  Social Connections: Socially Integrated (05/19/2017)   Social Connection and Isolation Panel [NHANES]   . Frequency of Communication with Friends and Family: More than three times a week   . Frequency of Social Gatherings with Friends and Family: Twice a week   . Attends Religious Services: 1 to 4 times per year   . Active Member of Clubs or Organizations: Yes   . Attends Archivist Meetings: 1 to 4 times per year   . Marital Status: Married  Human resources officer Violence: Unknown (05/19/2017)   Humiliation, Afraid, Rape, and Kick questionnaire   . Fear of Current or Ex-Partner: Patient refused   . Emotionally Abused: Patient refused   . Physically Abused: Patient refused   . Sexually Abused: Patient refused    Outpatient Medications Prior to Visit  Medication Sig Dispense Refill  . amLODipine (NORVASC) 5 MG tablet TAKE 1 TABLET DAILY 90 tablet 1  . metoprolol succinate (TOPROL-XL) 50 MG 24 hr tablet TAKE 1 TABLET DAILY, TAKE WITH OR IMMEDIATELY FOLLOWING A MEAL 90 tablet 1  . cephALEXin (KEFLEX) 500 MG capsule Take 1 capsule (500 mg total) by mouth 3 (three) times daily. 21 capsule 0   No facility-administered medications prior to visit.    No Known Allergies  Review of Systems  Constitutional:  Negative for fever.  HENT:  Negative for congestion, sinus pain and sore throat.   Respiratory:  Negative for cough, shortness of breath and wheezing.   Cardiovascular:  Negative for palpitations.  Gastrointestinal:  Negative for blood in stool,  constipation, diarrhea, nausea and vomiting.  Genitourinary:  Negative for dysuria, frequency and hematuria.  Musculoskeletal:  Negative for joint pain and myalgias.  Skin:  Negative for rash.       (-) [place]  Neurological:  Negative for headaches.  Psychiatric/Behavioral:  Negative for depression. The patient is not nervous/anxious.        Objective:    Physical Exam Constitutional:      General: She is not in acute distress.    Appearance: Normal appearance. She is not ill-appearing.  HENT:     Head: Normocephalic and atraumatic.     Right Ear: Tympanic membrane, ear canal and  external ear normal.     Left Ear: Tympanic membrane, ear canal and external ear normal.  Eyes:     Extraocular Movements: Extraocular movements intact.     Pupils: Pupils are equal, round, and reactive to light.  Neck:     Thyroid: No thyromegaly.  Cardiovascular:     Rate and Rhythm: Normal rate and regular rhythm.     Heart sounds: Normal heart sounds. No murmur heard.    No gallop.  Pulmonary:     Effort: Pulmonary effort is normal. No respiratory distress.     Breath sounds: Normal breath sounds. No wheezing or rales.  Chest:  Breasts:    Breasts are symmetrical.     Right: No inverted nipple or mass.     Left: No inverted nipple or mass.  Abdominal:     General: Bowel sounds are normal. There is no distension.     Palpations: Abdomen is soft.     Tenderness: There is no abdominal tenderness. There is no guarding.  Musculoskeletal:     Comments: 5/5 strength in both upper and lower extremities    Lymphadenopathy:     Cervical: No cervical adenopathy.  Skin:    General: Skin is warm and dry.  Neurological:     Mental Status: She is alert and oriented to person, place, and time.     Deep Tendon Reflexes:     Reflex Scores:      Patellar reflexes are 2+ on the right side and 2+ on the left side. Psychiatric:        Mood and Affect: Mood normal.        Behavior: Behavior normal.         Judgment: Judgment normal.    BP 134/78 (BP Location: Right Arm, Patient Position: Sitting, Cuff Size: Small)   Pulse (!) 57   Temp 98.3 F (36.8 C) (Oral)   Resp 16   Ht '5\' 8"'$  (1.727 m)   Wt 149 lb (67.6 kg)   SpO2 100%   BMI 22.66 kg/m  Wt Readings from Last 3 Encounters:  02/18/22 149 lb (67.6 kg)  08/02/21 146 lb 3.2 oz (66.3 kg)  05/29/21 140 lb (63.5 kg)       Assessment & Plan:   Problem List Items Addressed This Visit       Unprioritized   Preventative health care - Primary    Continue healthy diet and regular exercise.  Discussed sun safety.  Pap up to date. Declines flu shot.  Mammo due- ordered. [place]o up to date.       Hypertension    At goal. Continue amlodipine and metoprolol.       Other Visit Diagnoses     Encounter for screening mammogram for malignant neoplasm of breast       Relevant Orders   MM 3D SCREEN BREAST BILATERAL      No orders of the defined types were placed in this encounter.   I, Nance Pear, NP, personally preformed the services described in this documentation.  All medical record entries made by the scribe were at my direction and in my presence.  I have reviewed the chart and discharge instructions (if applicable) and agree that the record reflects my personal performance and is accurate and complete. 02/18/2022   I,Amber Collins,acting as a scribe for Nance Pear, NP.,have documented all relevant documentation on the behalf of Nance Pear, NP,as directed by  Nance Pear, NP while in the  presence of Nance Pear, NP.    Nance Pear, NP

## 2022-02-18 NOTE — Assessment & Plan Note (Addendum)
Continue healthy diet and regular exercise.  Discussed sun safety.  Pap up to date. Declines flu shot.  Mammo due- ordered. Colo up to date.

## 2022-02-18 NOTE — Assessment & Plan Note (Signed)
At goal. Continue amlodipine and metoprolol.

## 2022-02-20 DIAGNOSIS — L578 Other skin changes due to chronic exposure to nonionizing radiation: Secondary | ICD-10-CM | POA: Diagnosis not present

## 2022-02-20 DIAGNOSIS — C44519 Basal cell carcinoma of skin of other part of trunk: Secondary | ICD-10-CM | POA: Diagnosis not present

## 2022-02-20 DIAGNOSIS — D229 Melanocytic nevi, unspecified: Secondary | ICD-10-CM | POA: Diagnosis not present

## 2022-02-20 DIAGNOSIS — L821 Other seborrheic keratosis: Secondary | ICD-10-CM | POA: Diagnosis not present

## 2022-02-20 DIAGNOSIS — D485 Neoplasm of uncertain behavior of skin: Secondary | ICD-10-CM | POA: Diagnosis not present

## 2022-02-20 DIAGNOSIS — L814 Other melanin hyperpigmentation: Secondary | ICD-10-CM | POA: Diagnosis not present

## 2022-03-03 ENCOUNTER — Encounter (HOSPITAL_BASED_OUTPATIENT_CLINIC_OR_DEPARTMENT_OTHER): Payer: Self-pay

## 2022-03-03 ENCOUNTER — Ambulatory Visit (HOSPITAL_BASED_OUTPATIENT_CLINIC_OR_DEPARTMENT_OTHER)
Admission: RE | Admit: 2022-03-03 | Discharge: 2022-03-03 | Disposition: A | Discharge status: Home / Self Care | Payer: BC Managed Care – PPO | Source: Ambulatory Visit | Attending: Family | Admitting: Family

## 2022-03-03 DIAGNOSIS — Z1231 Encounter for screening mammogram for malignant neoplasm of breast: Secondary | ICD-10-CM | POA: Insufficient documentation

## 2022-03-05 ENCOUNTER — Encounter: Payer: Self-pay | Admitting: Family

## 2022-03-05 DIAGNOSIS — C44519 Basal cell carcinoma of skin of other part of trunk: Secondary | ICD-10-CM | POA: Diagnosis not present

## 2022-03-06 ENCOUNTER — Other Ambulatory Visit: Payer: Self-pay | Admitting: Family

## 2022-03-06 DIAGNOSIS — R928 Other abnormal and inconclusive findings on diagnostic imaging of breast: Secondary | ICD-10-CM

## 2022-04-18 ENCOUNTER — Other Ambulatory Visit: Payer: Self-pay | Admitting: Family

## 2022-04-18 ENCOUNTER — Ambulatory Visit
Admission: RE | Admit: 2022-04-18 | Discharge: 2022-04-18 | Disposition: A | Discharge status: Home / Self Care | Payer: BC Managed Care – PPO | Source: Ambulatory Visit | Attending: Family | Admitting: Family

## 2022-04-18 ENCOUNTER — Ambulatory Visit: Payer: BC Managed Care – PPO

## 2022-04-18 DIAGNOSIS — Z803 Family history of malignant neoplasm of breast: Secondary | ICD-10-CM | POA: Diagnosis not present

## 2022-04-18 DIAGNOSIS — N6310 Unspecified lump in the right breast, unspecified quadrant: Secondary | ICD-10-CM

## 2022-04-18 DIAGNOSIS — R922 Inconclusive mammogram: Secondary | ICD-10-CM | POA: Diagnosis not present

## 2022-04-18 DIAGNOSIS — R928 Other abnormal and inconclusive findings on diagnostic imaging of breast: Secondary | ICD-10-CM

## 2022-04-18 DIAGNOSIS — N6311 Unspecified lump in the right breast, upper outer quadrant: Secondary | ICD-10-CM | POA: Diagnosis not present

## 2022-04-29 ENCOUNTER — Ambulatory Visit
Admission: RE | Admit: 2022-04-29 | Discharge: 2022-04-29 | Disposition: A | Discharge status: Home / Self Care | Payer: BC Managed Care – PPO | Source: Ambulatory Visit | Attending: Family | Admitting: Family

## 2022-04-29 DIAGNOSIS — N6310 Unspecified lump in the right breast, unspecified quadrant: Secondary | ICD-10-CM

## 2022-04-29 DIAGNOSIS — N6311 Unspecified lump in the right breast, upper outer quadrant: Secondary | ICD-10-CM | POA: Diagnosis not present

## 2022-04-29 DIAGNOSIS — D241 Benign neoplasm of right breast: Secondary | ICD-10-CM | POA: Diagnosis not present

## 2022-04-29 DIAGNOSIS — N6011 Diffuse cystic mastopathy of right breast: Secondary | ICD-10-CM | POA: Diagnosis not present

## 2022-04-29 HISTORY — PX: BREAST BIOPSY: SHX20

## 2022-05-12 ENCOUNTER — Other Ambulatory Visit: Payer: Self-pay | Admitting: Family

## 2022-06-25 DIAGNOSIS — L578 Other skin changes due to chronic exposure to nonionizing radiation: Secondary | ICD-10-CM | POA: Diagnosis not present

## 2022-06-25 DIAGNOSIS — L814 Other melanin hyperpigmentation: Secondary | ICD-10-CM | POA: Diagnosis not present

## 2022-06-25 DIAGNOSIS — D485 Neoplasm of uncertain behavior of skin: Secondary | ICD-10-CM | POA: Diagnosis not present

## 2022-06-25 DIAGNOSIS — D1801 Hemangioma of skin and subcutaneous tissue: Secondary | ICD-10-CM | POA: Diagnosis not present

## 2022-06-25 DIAGNOSIS — C44319 Basal cell carcinoma of skin of other parts of face: Secondary | ICD-10-CM | POA: Diagnosis not present

## 2022-06-25 DIAGNOSIS — L821 Other seborrheic keratosis: Secondary | ICD-10-CM | POA: Diagnosis not present

## 2022-07-25 ENCOUNTER — Other Ambulatory Visit: Payer: Self-pay | Admitting: Family

## 2022-07-25 DIAGNOSIS — I10 Essential (primary) hypertension: Secondary | ICD-10-CM

## 2022-07-28 DIAGNOSIS — T50905A Adverse effect of unspecified drugs, medicaments and biological substances, initial encounter: Secondary | ICD-10-CM | POA: Diagnosis not present

## 2022-07-28 DIAGNOSIS — C44319 Basal cell carcinoma of skin of other parts of face: Secondary | ICD-10-CM | POA: Diagnosis not present

## 2022-08-27 ENCOUNTER — Ambulatory Visit: Payer: BC Managed Care – PPO | Admitting: Family

## 2022-08-27 VITALS — BP 138/78 | HR 60 | Resp 18 | Ht 68.0 in | Wt 158.0 lb

## 2022-08-27 DIAGNOSIS — I10 Essential (primary) hypertension: Secondary | ICD-10-CM

## 2022-08-27 LAB — BASIC METABOLIC PANEL
BUN: 17 mg/dL (ref 6–23)
CO2: 25 mEq/L (ref 19–32)
Calcium: 8.8 mg/dL (ref 8.4–10.5)
Chloride: 105 mEq/L (ref 96–112)
Creatinine, Ser: 0.81 mg/dL (ref 0.40–1.20)
GFR: 85.7 mL/min (ref >60.00)
Glucose, Bld: 83 mg/dL (ref 70–99)
Potassium: 4.2 mEq/L (ref 3.5–5.1)
Sodium: 137 mEq/L (ref 135–145)

## 2022-08-27 NOTE — Assessment & Plan Note (Signed)
BP is acceptable. I have asked the patient to make sure that she keeps an eye on her blood pressure at home and notify me if she starts to see SBP numbers >140.

## 2022-08-27 NOTE — Progress Notes (Signed)
Subjective:   By signing my name below, I, Barrett Shell, attest that this documentation has been prepared under the direction and in the presence of Sandford Craze, NP. 08/27/2022   Patient ID: Marissa Levine, female    DOB: 08-26-1975, 47 y.o.   MRN: [id number]  Chief Complaint  Patient presents with   Follow-up    6 month    HPI Patient is in today for a follow-up appointment.   Blood pressure: Her blood pressure is stable during this visit. She is taking 5 mg amlodipine daily PO and 50 mg metoprolol succinate daily PO to manage it.  BP Readings from Last 3 Encounters:  08/27/22 138/78  02/18/22 134/78  08/02/21 108/80    Pulse Readings from Last 3 Encounters:  08/27/22 60  02/18/22 (!) 57  08/02/21 (!) 58    Immunizations: She is UTD on the latest Covid-19 immunization. She is not interested in receiving the HIV/Hepatitis B screening.   Past Medical History:  Diagnosis Date   Hypertension    on meds   Migraine    hx of   Surgical wound infection 08/02/2021    Past Surgical History:  Procedure Laterality Date   BREAST BIOPSY Right 04/29/2022   [place] RT BREAST BX W LOC DEV EA ADD LESION IMG BX SPEC US GUIDE 04/29/2022 [facility]   BREAST BIOPSY Right 04/29/2022   [place] RT BREAST BX W LOC DEV 1ST LESION IMG BX SPEC US GUIDE 04/29/2022 [facility]   MOHS SURGERY  07/2021   left side of nose and left anterior chest, done at [facility]   WISDOM TOOTH EXTRACTION      Family History  Problem Relation Age of Onset   Breast cancer Mother 29   Cancer Mother 33       breast   Hypertension Mother    Cancer Father        basal cell carcinoma   Hypertension Father    Hypertension Maternal Grandmother    Hypertension Maternal Grandfather    Hypertension Paternal Grandmother    Hypertension Paternal Grandfather    Heart attack Paternal Grandfather    Colon polyps Neg Hx    Colon cancer Neg Hx    Esophageal cancer Neg Hx    Rectal cancer Neg Hx    Stomach  cancer Neg Hx     Social History   Socioeconomic History   Marital status: Married    Spouse name: Baldo Ash   Number of children: 2   Years of education: 18   Highest education level: Master's degree (e.g., MA, MS, MEng, MEd, MSW, MBA)  Occupational History   Occupation: Press photographer: [employer]  Tobacco Use   Smoking status: Never   Smokeless tobacco: Never  Vaping Use   Vaping Use: Never used  Substance and Sexual Activity   Alcohol use: Yes    Alcohol/week: 7.0 standard drinks of alcohol    Types: 7 Standard drinks or equivalent per week   Drug use: No   Sexual activity: Yes    Partners: Male    Birth control/protection: None    Comment: Husband had vasectomy  Other Topics Concern   Not on file  Social History Narrative   Two children   Married   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)   Enjoys running    Dog and cat   Completed a Scientist, water quality in [employer]   Social Determinants of Health  Financial Resource Strain: Low Risk  (08/27/2022)   Overall Financial Resource Strain (CARDIA)    Difficulty of Paying Living Expenses: Not very hard  Food Insecurity: No Food Insecurity (08/27/2022)   Hunger Vital Sign    Worried About Running Out of Food in the Last Year: Never true    Ran Out of Food in the Last Year: Never true  Transportation Needs: No Transportation Needs (08/27/2022)   PRAPARE - Administrator, Civil Service (Medical): No    Lack of Transportation (Non-Medical): No  Physical Activity: Sufficiently Active (08/27/2022)   Exercise Vital Sign    Days of Exercise per Week: 5 days    Minutes of Exercise per Session: 60 min  Stress: No Stress Concern Present (08/27/2022)   [employer] of Occupational Health - Occupational Stress Questionnaire    Feeling of Stress : Only a little  Social Connections: Moderately Integrated (08/27/2022)   Social Connection and Isolation Panel [NHANES]    Frequency of Communication with Friends  and Family: More than three times a week    Frequency of Social Gatherings with Friends and Family: More than three times a week    Attends Religious Services: Never    Database administrator or Organizations: Yes    Attends Banker Meetings: 1 to 4 times per year    Marital Status: Married  Catering manager Violence: Unknown (05/19/2017)   Humiliation, Afraid, Rape, and Kick questionnaire    Fear of Current or Ex-Partner: Patient declined    Emotionally Abused: Patient declined    Physically Abused: Patient declined    Sexually Abused: Patient declined    Outpatient Medications Prior to Visit  Medication Sig Dispense Refill   amLODipine (NORVASC) 5 MG tablet TAKE 1 TABLET DAILY 90 tablet 1   metoprolol succinate (TOPROL-XL) 50 MG 24 hr tablet TAKE 1 TABLET DAILY, TAKE WITH OR IMMEDIATELY FOLLOWING A MEAL 90 tablet 1   No facility-administered medications prior to visit.    No Known Allergies  ROS     Objective:    Physical Exam Constitutional:      General: She is not in acute distress.    Appearance: Normal appearance.  HENT:     Head: Normocephalic and atraumatic.     Right Ear: External ear normal.     Left Ear: External ear normal.  Eyes:     Extraocular Movements: Extraocular movements intact.     Pupils: Pupils are equal, round, and reactive to light.  Cardiovascular:     Rate and Rhythm: Normal rate and regular rhythm.     Heart sounds: Normal heart sounds. No murmur heard.    No gallop.  Pulmonary:     Effort: Pulmonary effort is normal. No respiratory distress.     Breath sounds: Normal breath sounds. No wheezing or rales.  Skin:    General: Skin is warm.  Neurological:     Mental Status: She is alert and oriented to person, place, and time.  Psychiatric:        Judgment: Judgment normal.     BP 138/78   Pulse 60   Resp 18   Ht  (1.727 m)   Wt 158 lb (71.7 kg)   SpO2 100%   BMI 24.02 kg/m  Wt Readings from Last 3 Encounters:   08/27/22 158 lb (71.7 kg)  02/18/22 149 lb (67.6 kg)  08/02/21 146 lb 3.2 oz (66.3 kg)       Assessment &  Plan:  Primary hypertension Assessment & Plan: BP is acceptable. I have asked the patient to make sure that she keeps an eye on her blood pressure at home and notify me if she starts to see SBP numbers >140.  Orders: -     Basic metabolic panel    I, Lemont Fillers, NP, personally preformed the services described in this documentation.  All medical record entries made by the scribe were at my direction and in my presence.  I have reviewed the chart and discharge instructions (if applicable) and agree that the record reflects my personal performance and is accurate and complete. 08/27/2022  Lemont Fillers, NP  Mercer Pod as a scribe for Lemont Fillers, NP.,have documented all relevant documentation on the behalf of Lemont Fillers, NP,as directed by  Lemont Fillers, NP while in the presence of Lemont Fillers, NP.

## 2022-10-26 DIAGNOSIS — N390 Urinary tract infection, site not specified: Secondary | ICD-10-CM | POA: Diagnosis not present

## 2022-10-27 DIAGNOSIS — D1801 Hemangioma of skin and subcutaneous tissue: Secondary | ICD-10-CM | POA: Diagnosis not present

## 2022-10-27 DIAGNOSIS — L570 Actinic keratosis: Secondary | ICD-10-CM | POA: Diagnosis not present

## 2022-10-27 DIAGNOSIS — L578 Other skin changes due to chronic exposure to nonionizing radiation: Secondary | ICD-10-CM | POA: Diagnosis not present

## 2022-10-27 DIAGNOSIS — L814 Other melanin hyperpigmentation: Secondary | ICD-10-CM | POA: Diagnosis not present

## 2022-10-27 DIAGNOSIS — L821 Other seborrheic keratosis: Secondary | ICD-10-CM | POA: Diagnosis not present

## 2022-11-07 ENCOUNTER — Other Ambulatory Visit: Payer: Self-pay | Admitting: Family

## 2023-01-21 ENCOUNTER — Other Ambulatory Visit: Payer: Self-pay | Admitting: Family

## 2023-01-21 DIAGNOSIS — I10 Essential (primary) hypertension: Secondary | ICD-10-CM

## 2023-02-20 ENCOUNTER — Encounter: Payer: BC Managed Care – PPO | Admitting: Family

## 2023-02-25 ENCOUNTER — Encounter: Payer: BC Managed Care – PPO | Admitting: Family

## 2023-06-01 ENCOUNTER — Encounter: Payer: Self-pay | Admitting: Family

## 2023-06-01 ENCOUNTER — Encounter: Payer: Managed Care, Other (non HMO) | Admitting: Family

## 2023-06-01 ENCOUNTER — Ambulatory Visit (INDEPENDENT_AMBULATORY_CARE_PROVIDER_SITE_OTHER): Payer: Managed Care, Other (non HMO) | Admitting: Family

## 2023-06-01 VITALS — BP 127/72 | HR 64 | Temp 97.6°F | Resp 16 | Ht 69.0 in | Wt 170.0 lb

## 2023-06-01 DIAGNOSIS — G43909 Migraine, unspecified, not intractable, without status migrainosus: Secondary | ICD-10-CM | POA: Diagnosis not present

## 2023-06-01 DIAGNOSIS — I10 Essential (primary) hypertension: Secondary | ICD-10-CM | POA: Diagnosis not present

## 2023-06-01 DIAGNOSIS — C4491 Basal cell carcinoma of skin, unspecified: Secondary | ICD-10-CM

## 2023-06-01 DIAGNOSIS — Z1231 Encounter for screening mammogram for malignant neoplasm of breast: Secondary | ICD-10-CM

## 2023-06-01 DIAGNOSIS — Z0000 Encounter for general adult medical examination without abnormal findings: Secondary | ICD-10-CM

## 2023-06-01 DIAGNOSIS — Z23 Encounter for immunization: Secondary | ICD-10-CM | POA: Diagnosis not present

## 2023-06-01 DIAGNOSIS — R635 Abnormal weight gain: Secondary | ICD-10-CM

## 2023-06-01 LAB — CBC WITH DIFFERENTIAL/PLATELET
Basophils Absolute: 0 10*3/uL (ref 0.0–0.1)
Basophils Relative: 0.6 % (ref 0.0–3.0)
Eosinophils Absolute: 0.3 10*3/uL (ref 0.0–0.7)
Eosinophils Relative: 5.5 % — ABNORMAL HIGH (ref 0.0–5.0)
HCT: 42.7 % (ref 36.0–46.0)
Hemoglobin: 14.4 g/dL (ref 12.0–15.0)
Lymphocytes Relative: 33.4 % (ref 12.0–46.0)
Lymphs Abs: 1.8 10*3/uL (ref 0.7–4.0)
MCHC: 33.7 g/dL (ref 30.0–36.0)
MCV: 94.1 fL (ref 78.0–100.0)
Monocytes Absolute: 0.4 10*3/uL (ref 0.1–1.0)
Monocytes Relative: 7.9 % (ref 3.0–12.0)
Neutro Abs: 2.9 10*3/uL (ref 1.4–7.7)
Neutrophils Relative %: 52.6 % (ref 43.0–77.0)
Platelets: 268 10*3/uL (ref 150.0–400.0)
RBC: 4.54 Mil/uL (ref 3.87–5.11)
RDW: 13.3 % (ref 11.5–15.5)
WBC: 5.5 10*3/uL (ref 4.0–10.5)

## 2023-06-01 LAB — LIPID PANEL
Cholesterol: 191 mg/dL (ref 0–200)
HDL: 65 mg/dL (ref >39.00)
LDL Cholesterol: 113 mg/dL — ABNORMAL HIGH (ref 0–99)
NonHDL: 126.34
Total CHOL/HDL Ratio: 3
Triglycerides: 66 mg/dL (ref 0.0–149.0)
VLDL: 13.2 mg/dL (ref 0.0–40.0)

## 2023-06-01 LAB — COMPREHENSIVE METABOLIC PANEL
ALT: 12 U/L (ref 0–35)
AST: 17 U/L (ref 0–37)
Albumin: 4.3 g/dL (ref 3.5–5.2)
Alkaline Phosphatase: 48 U/L (ref 39–117)
BUN: 11 mg/dL (ref 6–23)
CO2: 26 meq/L (ref 19–32)
Calcium: 8.9 mg/dL (ref 8.4–10.5)
Chloride: 103 meq/L (ref 96–112)
Creatinine, Ser: 0.79 mg/dL (ref 0.40–1.20)
GFR: 87.84 mL/min (ref >60.00)
Glucose, Bld: 93 mg/dL (ref 70–99)
Potassium: 4 meq/L (ref 3.5–5.1)
Sodium: 138 meq/L (ref 135–145)
Total Bilirubin: 0.7 mg/dL (ref 0.2–1.2)
Total Protein: 6.8 g/dL (ref 6.0–8.3)

## 2023-06-01 LAB — TSH: TSH: 2.82 u[IU]/mL (ref 0.35–5.50)

## 2023-06-01 MED ORDER — AMLODIPINE BESYLATE 5 MG PO TABS: 5.0000 mg | ORAL_TABLET | Freq: Every day | ORAL | 1 refills | Status: DC

## 2023-06-01 MED ORDER — METOPROLOL SUCCINATE ER 50 MG PO TB24: 50.0000 mg | ORAL_TABLET | Freq: Every day | ORAL | 1 refills | Status: DC

## 2023-06-01 NOTE — Assessment & Plan Note (Addendum)
At goal, continue amlodipine and toprol xl.

## 2023-06-01 NOTE — Assessment & Plan Note (Signed)
Has had several basal cell carcinoma's removed. Has Moh's scheduled next week for lesion on her right forehead.

## 2023-06-01 NOTE — Addendum Note (Signed)
Addended by: Wilford Corner on: 06/01/2023 10:15 AM   Modules accepted: Orders

## 2023-06-01 NOTE — Assessment & Plan Note (Signed)
No recent migraines- monitor.

## 2023-06-01 NOTE — Patient Instructions (Signed)
VISIT SUMMARY:  During your visit today, we reviewed your current health status and addressed your concerns about weight gain, changes in your menstrual cycle, and your history of hypertension. Your blood pressure is well controlled, and we discussed your adherence to your medication regimen. We also talked about your recent weight gain despite a healthy lifestyle, and we planned some tests to investigate further. Additionally, we reviewed your immunization status and scheduled necessary screenings.  YOUR PLAN:  -HYPERTENSION: Hypertension, or high blood pressure, is a condition where the force of the blood against your artery walls is too high. Your blood pressure is well controlled with your current medications, Amlodipine 5mg  and Metoprolol 50mg . Please continue taking these medications as prescribed.  -WEIGHT GAIN: Despite maintaining a healthy diet and regular exercise, you have experienced weight gain. We will check your thyroid function to rule out hypothyroidism, which can cause weight gain. Please continue with your healthy diet and regular exercise.  -CONSTIPATION: Constipation is when you have infrequent or difficult bowel movements. To help with this, you can try using MiraLAX as needed to promote regular bowel movements.  -IMMUNIZATIONS: You are due for a Tetanus vaccine and have not received a flu shot this year. We will administer the Tetanus vaccine today and offer you the flu shot.  -SCREENING: You are due for a mammogram, especially given your history of dense breasts and a previous benign biopsy. We will schedule this for you. Your next Pap smear is due in September 2027, and your next colonoscopy is due in January 2033.  -GENERAL HEALTH MAINTENANCE: We encourage you to continue with regular dental and vision check-ups. We will also check your cholesterol and blood count. Please follow up in 6 months for a blood pressure check.  INSTRUCTIONS:  Please follow up in 6 months for a  blood pressure check. We will also schedule your mammogram and check your thyroid function. Continue with your current medications and healthy lifestyle choices.

## 2023-06-01 NOTE — Assessment & Plan Note (Signed)
  Screening Last mammogram resulted in biopsy with benign findings. Last colonoscopy January 2023. Last Pap smear September 2022, negative including negative high-risk HPV. -Schedule mammogram. (Neg biopsy 2023) -Next Pap smear due September 2027. -Next colonoscopy due January 2033.  General Health Maintenance -Encourage regular dental and vision check-ups. -Check cholesterol and blood count. She understands that screening labs may not be covered.  -Follow-up in 6 months for blood pressure check.

## 2023-06-01 NOTE — Progress Notes (Signed)
Subjective:     Patient ID: Marissa Levine, female    DOB: 05/11/1976, 48 y.o.   MRN: [id number]  No chief complaint on file.   HPI  Discussed the use of AI scribe software for clinical note transcription with the patient, who gave verbal consent to proceed.  History of Present Illness   The patient, with a history of hypertension managed with amlodipine 5mg  and metoprolol 50mg , presents for a routine physical and medication refills. She reports good adherence to her antihypertensive regimen and has noticed a significant increase in blood pressure when she missed doses during a trip. Her blood pressure today is well controlled at 127/72. She denies recent migraines.  The patient's primary concern is weight gain despite maintaining a healthy diet and regular exercise. She reports consuming 1200-1500 calories per day, primarily from protein sources, and performing cardio and weight training exercises daily. Despite these efforts, she has noticed a steady increase in weight and feels "plump." Her clothes feel tight, and her BMI is 25.1. She expresses frustration with this weight gain and wonders if it is related to midlife changes.  The patient also reports changes in her menstrual cycle, describing it as "shorter" and "more yokey." She denies experiencing hot flashes. She has a history of dense breasts and underwent a stressful biopsy a year ago, which turned out to be benign. She is due for a mammogram and expresses some anxiety about the process due to her past experience.  The patient is up-to-date on her pap smear, which was negative, including a negative high-risk HPV. She reports regular dental and vision check-ups, although she notes that her vision prescription has needed frequent updates in recent years. She has a history of basal cell carcinoma and is on a four-month rotation for skin checks.      Immunizations: td today, declines flu Diet: healthy Wt Readings from Last 3  Encounters:  06/01/23 170 lb (77.1 kg)  08/27/22 158 lb (71.7 kg)  02/18/22 149 lb (67.6 kg)  Exercise: regular Colonoscopy: 1/23 due 1/30 Pap Smear: due 9/27 Mammogram: due Vision: due, will schedule Dental: up to date      Health Maintenance Due  Topic Date Due   COVID-19 Vaccine (6 - 2024-25 season) 01/11/2023   MAMMOGRAM  04/19/2023   DTaP/Tdap/Td (2 - Td or Tdap) 05/20/2023    Past Medical History:  Diagnosis Date   Hypertension    on meds   Migraine    hx of   Surgical wound infection 08/02/2021    Past Surgical History:  Procedure Laterality Date   BREAST BIOPSY Right 04/29/2022   [place] RT BREAST BX W LOC DEV EA ADD LESION IMG BX SPEC US GUIDE 04/29/2022 [facility]   BREAST BIOPSY Right 04/29/2022   [place] RT BREAST BX W LOC DEV 1ST LESION IMG BX SPEC US GUIDE 04/29/2022 [facility]   MOHS SURGERY  07/2021   left side of nose and left anterior chest, done at [facility] TOOTH EXTRACTION      Family History  Problem Relation Age of Onset   Breast cancer Mother 41   Cancer Mother 81       breast   Hypertension Mother    Cancer Father        basal cell carcinoma   Hypertension Father    Hypertension Maternal Grandmother    Hypertension Maternal Grandfather    Hypertension Paternal Grandmother    Hypertension Paternal Grandfather    Heart  attack Paternal Grandfather    Colon polyps Neg Hx    Colon cancer Neg Hx    Esophageal cancer Neg Hx    Rectal cancer Neg Hx    Stomach cancer Neg Hx     Social History   Socioeconomic History   Marital status: Married    Spouse name: Baldo Ash   Number of children: 2   Years of education: 18   Highest education level: Master's degree (e.g., MA, MS, MEng, MEd, MSW, MBA)  Occupational History   Occupation: Press photographer: [employer]  Tobacco Use   Smoking status: Never   Smokeless tobacco: Never  Vaping Use   Vaping status: Never Used  Substance and Sexual Activity   Alcohol  use: Yes    Alcohol/week: 7.0 standard drinks of alcohol    Types: 7 Standard drinks or equivalent per week   Drug use: No   Sexual activity: Yes    Partners: Male    Birth control/protection: None    Comment: Husband had vasectomy  Other Topics Concern   Not on file  Social History Narrative   Two children (17 and 48)   Married   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)   Enjoys running    Dog and cat   Completed a Scientist, water quality in Sports coach   Social Drivers of Corporate investment banker Strain: Low Risk  (05/31/2023)   Overall Financial Resource Strain (CARDIA)    Difficulty of Paying Living Expenses: Not hard at all  Food Insecurity: No Food Insecurity (05/31/2023)   Hunger Vital Sign    Worried About Running Out of Food in the Last Year: Never true    Ran Out of Food in the Last Year: Never true  Transportation Needs: No Transportation Needs (05/31/2023)   PRAPARE - Administrator, Civil Service (Medical): No    Lack of Transportation (Non-Medical): No  Physical Activity: Sufficiently Active (05/31/2023)   Exercise Vital Sign    Days of Exercise per Week: 5 days    Minutes of Exercise per Session: 60 min  Stress: Stress Concern Present (05/31/2023)   [employer] of Occupational Health - Occupational Stress Questionnaire    Feeling of Stress : To some extent  Social Connections: Moderately Integrated (05/31/2023)   Social Connection and Isolation Panel [NHANES]    Frequency of Communication with Friends and Family: More than three times a week    Frequency of Social Gatherings with Friends and Family: Twice a week    Attends Religious Services: Never    Database administrator or Organizations: No    Attends Engineer, structural: 1 to 4 times per year    Marital Status: Married  Catering manager Violence: Unknown (05/19/2017)   Humiliation, Afraid, Rape, and Kick questionnaire    Fear of Current or Ex-Partner: Patient declined    Emotionally Abused:  Patient declined    Physically Abused: Patient declined    Sexually Abused: Patient declined    Outpatient Medications Prior to Visit  Medication Sig Dispense Refill   amLODipine (NORVASC) 5 MG tablet TAKE 1 TABLET DAILY 90 tablet 1   metoprolol succinate (TOPROL-XL) 50 MG 24 hr tablet TAKE 1 TABLET DAILY, TAKE WITH OR IMMEDIATELY FOLLOWING A MEAL 90 tablet 3   No facility-administered medications prior to visit.    No Known Allergies  Review of Systems  Constitutional:  Negative for weight loss.  HENT:  Negative for  congestion and hearing loss.   Eyes:  Negative for blurred vision.  Respiratory:  Negative for cough and shortness of breath.   Cardiovascular:  Negative for chest pain and leg swelling.  Gastrointestinal:  Positive for constipation. Negative for diarrhea.  Genitourinary:  Negative for dysuria and frequency.       Periods are regular, shorter, no hot flashes  Musculoskeletal:  Negative for joint pain and myalgias.  Skin:  Negative for rash.  Neurological:  Negative for headaches.  Psychiatric/Behavioral:         Denies depression/anxiety       Objective:    Physical Exam   BP 127/72 (BP Location: Right Arm, Patient Position: Sitting, Cuff Size: Normal)   Pulse 64   Temp 97.6 F (36.4 C) (Oral)   Resp 16   Ht 5\' 9"  (1.753 m)   Wt 170 lb (77.1 kg)   SpO2 100%   BMI 25.10 kg/m  Wt Readings from Last 3 Encounters:  06/01/23 170 lb (77.1 kg)  08/27/22 158 lb (71.7 kg)  02/18/22 149 lb (67.6 kg)       Assessment & Plan:   Problem List Items Addressed This Visit       Unprioritized   Preventative health care    Screening Last mammogram resulted in biopsy with benign findings. Last colonoscopy January 2023. Last Pap smear September 2022, negative including negative high-risk HPV. -Schedule mammogram. (Neg biopsy 2023) -Next Pap smear due September 2027. -Next colonoscopy due January 2033.  General Health Maintenance -Encourage regular dental  and vision check-ups. -Check cholesterol and blood count. She understands that screening labs may not be covered.  -Follow-up in 6 months for blood pressure check.      Relevant Orders   Comp Met (CMET)   CBC w/Diff   Lipid panel   Migraines   No recent migraines- monitor.       Relevant Medications   metoprolol succinate (TOPROL-XL) 50 MG 24 hr tablet   amLODipine (NORVASC) 5 MG tablet   Hypertension - Primary   At goal, continue amlodipine and toprol xl.          Relevant Medications   metoprolol succinate (TOPROL-XL) 50 MG 24 hr tablet   amLODipine (NORVASC) 5 MG tablet   Basal cell carcinoma   Has had several basal cell carcinoma's removed. Has Moh's scheduled next week for lesion on her right forehead.      Other Visit Diagnoses       Essential hypertension       Relevant Medications   metoprolol succinate (TOPROL-XL) 50 MG 24 hr tablet   amLODipine (NORVASC) 5 MG tablet     Weight gain       Relevant Orders   TSH     Breast cancer screening by mammogram       Relevant Orders   MM 3D SCREENING MAMMOGRAM BILATERAL BREAST       I have changed Winston L. Vandruff "Katie"'s metoprolol succinate and amLODipine.  Meds ordered this encounter  Medications   metoprolol succinate (TOPROL-XL) 50 MG 24 hr tablet    Sig: Take 1 tablet (50 mg total) by mouth daily. Take with or immediately following a meal.    Dispense:  90 tablet    Refill:  1   amLODipine (NORVASC) 5 MG tablet    Sig: Take 1 tablet (5 mg total) by mouth daily.    Dispense:  90 tablet    Refill:  1

## 2023-06-08 ENCOUNTER — Encounter (HOSPITAL_BASED_OUTPATIENT_CLINIC_OR_DEPARTMENT_OTHER): Payer: Self-pay

## 2023-06-08 ENCOUNTER — Ambulatory Visit (HOSPITAL_BASED_OUTPATIENT_CLINIC_OR_DEPARTMENT_OTHER)
Admission: RE | Admit: 2023-06-08 | Discharge: 2023-06-08 | Disposition: A | Discharge status: Home / Self Care | Payer: Managed Care, Other (non HMO) | Source: Ambulatory Visit | Attending: Family | Admitting: Family

## 2023-06-08 DIAGNOSIS — Z1231 Encounter for screening mammogram for malignant neoplasm of breast: Secondary | ICD-10-CM | POA: Diagnosis present

## 2023-07-11 IMAGING — MG MM DIGITAL SCREENING BILAT W/ TOMO AND CAD
8 series · 9 of 24 positions shown · non-contrast
Comparison: Previous exam(s).

CLINICAL DATA: Screening.

EXAM:
DIGITAL SCREENING BILATERAL MAMMOGRAM WITH TOMOSYNTHESIS AND CAD
TECHNIQUE: Bilateral screening digital craniocaudal and mediolateral oblique
mammograms were obtained. Bilateral screening digital breast
tomosynthesis was performed. The images were evaluated with
computer-aided detection.

[L CC synth-2D]
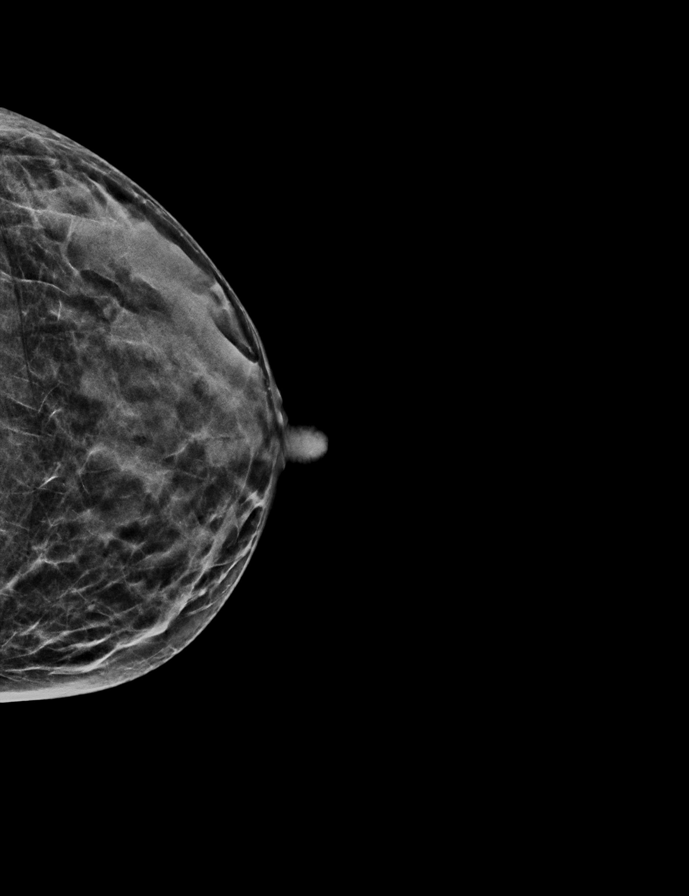

[L MLO synth-2D]
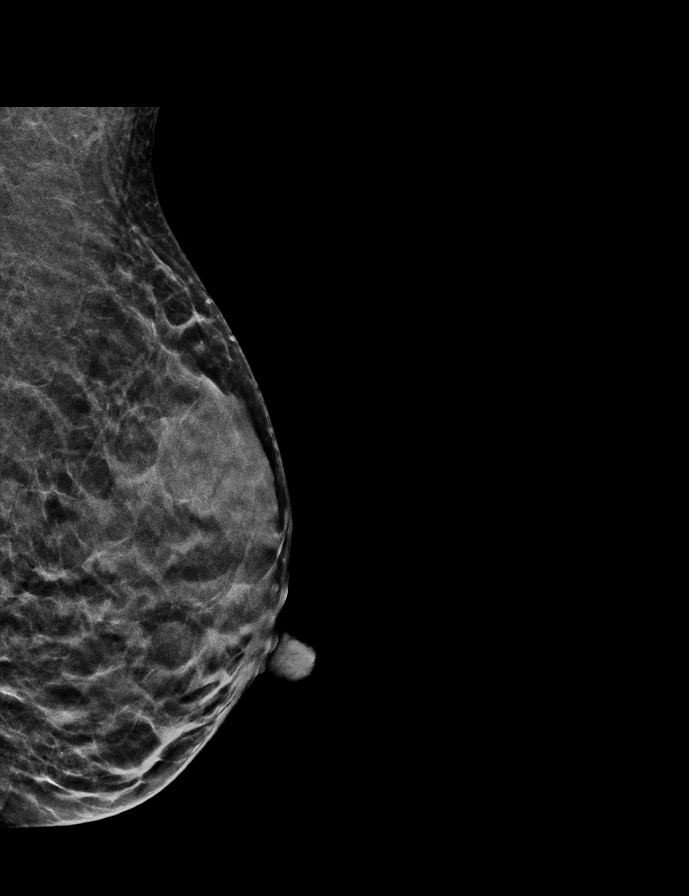

[R CC synth-2D]
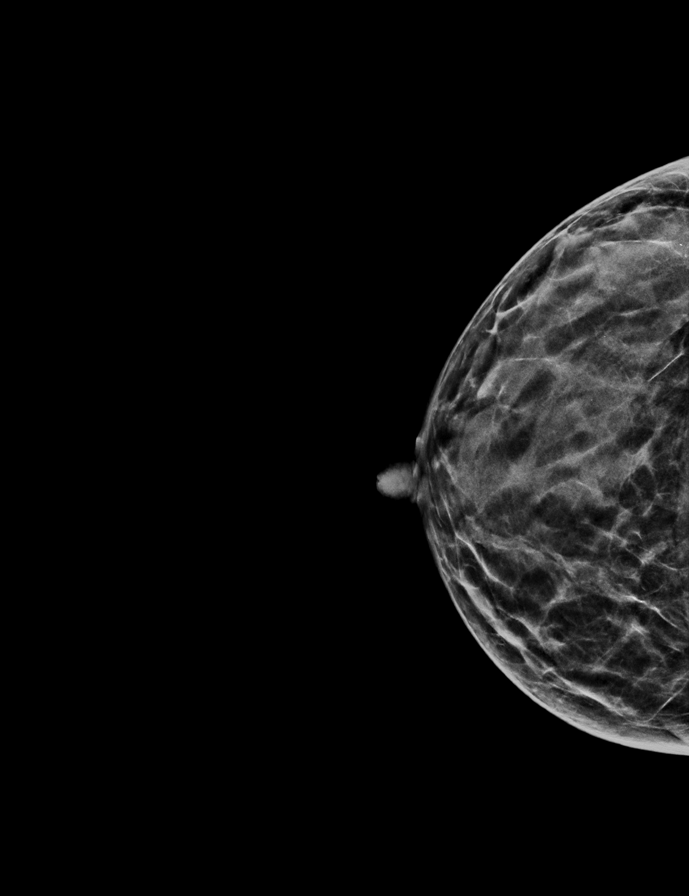

[R MLO synth-2D]
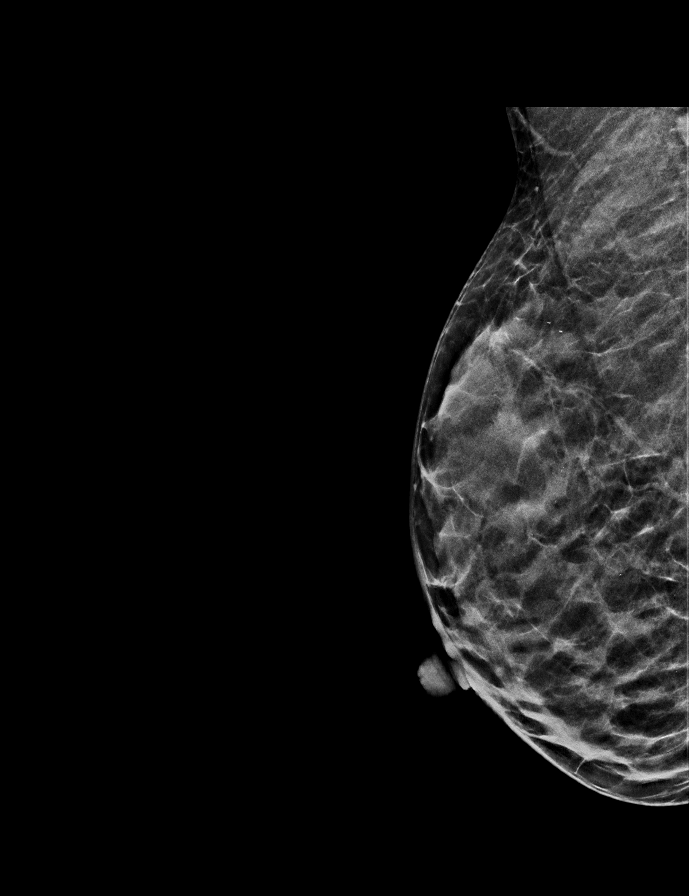

[R CC tomo · 2 of 41 frames shown]
[frame 14/41]
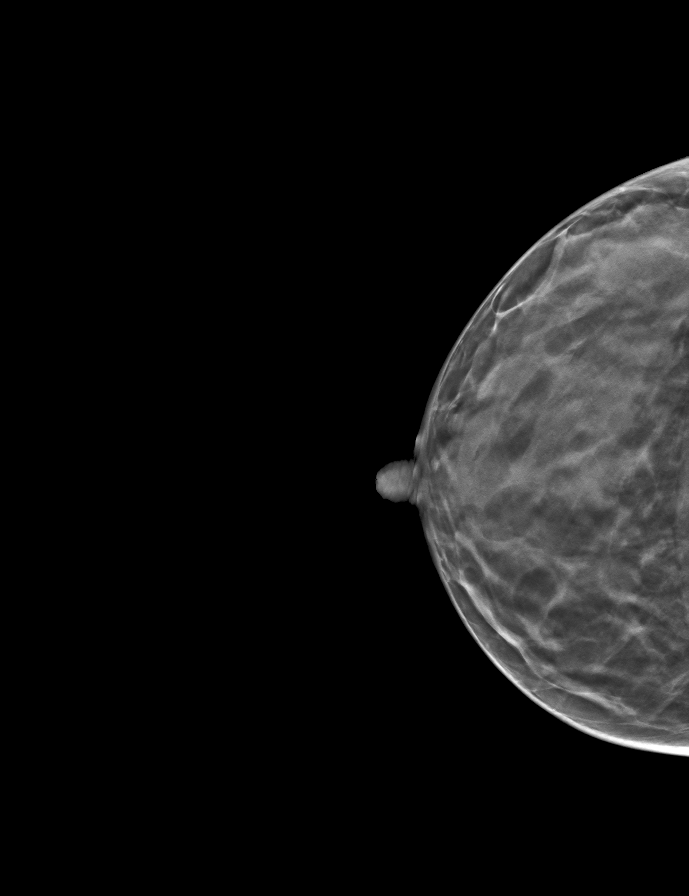
[frame 21/41]
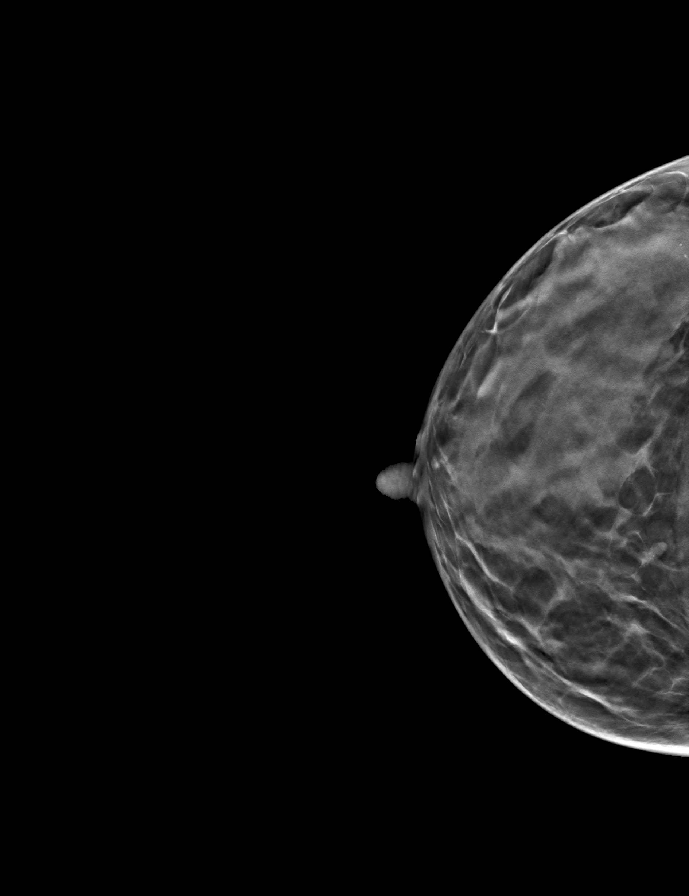

[L CC tomo · tomo slice 21/41.0]
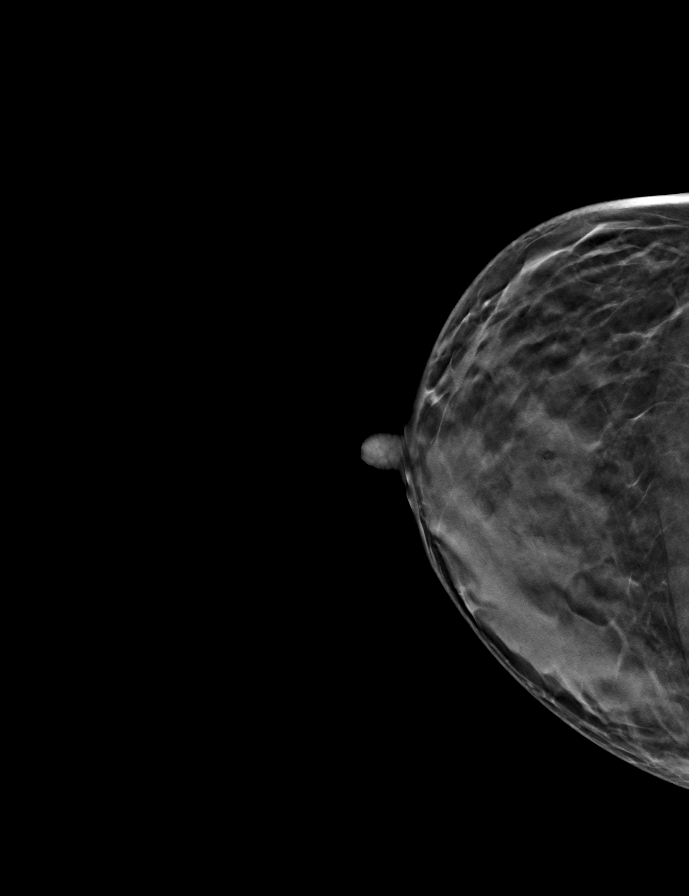

[R MLO tomo · tomo slice 20/39.0]
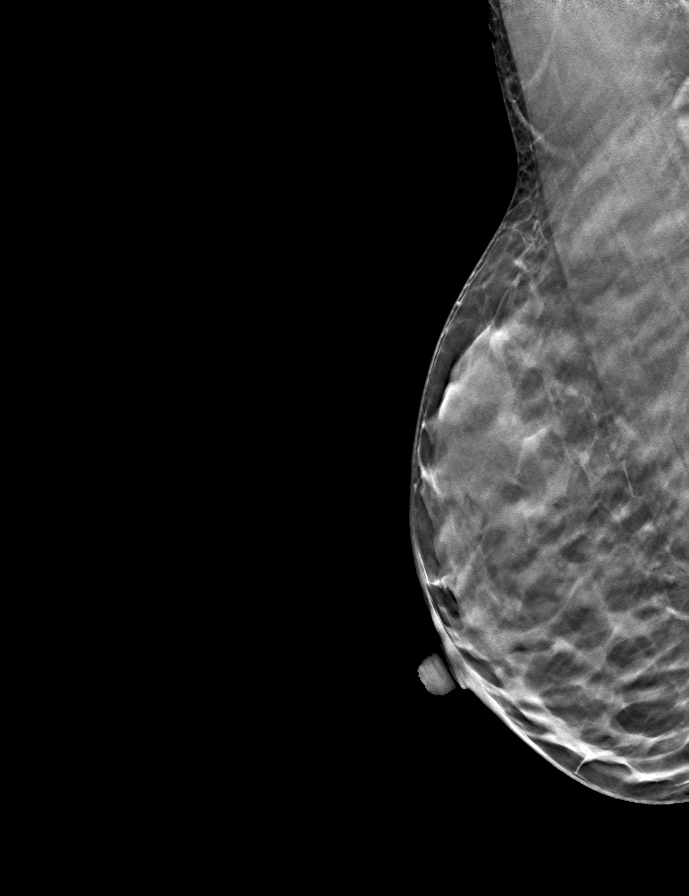

[L MLO tomo · tomo slice 19/37.0]
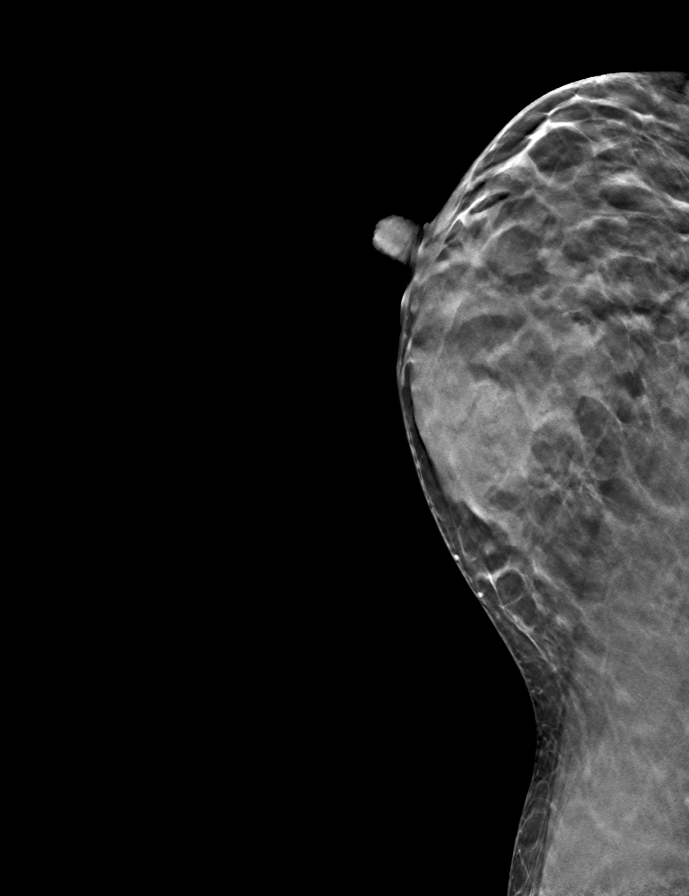

[9 of 24 positions shown; findings below may reference images not displayed]

ACR Breast Density Category c: The breast tissue is heterogeneously
dense, which may obscure small masses.
FINDINGS: There are no findings suspicious for malignancy.
IMPRESSION: No mammographic evidence of malignancy. A result letter of this
screening mammogram will be mailed directly to the patient.

RECOMMENDATION:
Screening mammogram in one year. (Code:Q3-W-BC3)

BI-RADS CATEGORY  1: Negative.

## 2023-11-22 ENCOUNTER — Ambulatory Visit
Admission: RE | Admit: 2023-11-22 | Discharge: 2023-11-22 | Disposition: A | Discharge status: Home / Self Care | Source: Ambulatory Visit | Attending: Emergency Medicine | Admitting: Emergency Medicine

## 2023-11-22 VITALS — BP 133/84 | HR 56 | Temp 98.1°F | Resp 16

## 2023-11-22 DIAGNOSIS — H5712 Ocular pain, left eye: Secondary | ICD-10-CM

## 2023-11-22 DIAGNOSIS — H5702 Anisocoria: Secondary | ICD-10-CM

## 2023-11-22 MED ORDER — MOXIFLOXACIN HCL 0.5 % OP SOLN: 1.0000 [drp] | Freq: Three times a day (TID) | OPHTHALMIC | 0 refills | Status: AC

## 2023-11-22 NOTE — Discharge Instructions (Signed)
 Vigamox  -- 1 drop in the left eye, 3 times daily, for 7 days  Call Dr Octavia tomorrow morning to make a follow up appointment   With any new symptoms, especially sudden vision loss or severe eye pain, go directly to the emergency department.

## 2023-11-22 NOTE — ED Triage Notes (Signed)
 Patient states that she was at  golf tournament 4 weeks ago. Went to an UC in [place] that gave her a steroid shot and eye drops. Got better afterwords but then started hurting again yesterday.    Left eye  Itchying Drainage Painful.

## 2023-11-22 NOTE — ED Provider Notes (Signed)
 [facility]    CSN: [id number] Arrival date & time: 11/22/23  1138     History   Chief Complaint Chief Complaint  Patient presents with   Eye Problem    One eye extremely irritated and light sensitivity, water drainage.   Second day - Entered by patient    HPI Marissa Levine is a 48 y.o. female.  Here with 1 day history of left eye issue Having watery drainage, pain when she blinks, burning sensation and itching Sensitive to light, and blurry vision. No swelling of eyelid, no pain in the face or sinuses. Denies headache. Denies sudden loss of vision or acute onset eye pain Denies fever, nausea/vomiting, head injury No weakness in extremities or balance issues  Similar symptoms happened about a month ago, went to different urgent care who prescribed combo abx and steroid drops. Symptoms had resolved until today.  Past Medical History:  Diagnosis Date   Hypertension    on meds   Migraine    hx of   Surgical wound infection 08/02/2021    Patient Active Problem List   Diagnosis Date Noted   Basal cell carcinoma 08/02/2021   Preventative health care 02/01/2021   Hypertension 05/19/2017   Migraines 05/19/2017    Past Surgical History:  Procedure Laterality Date   BREAST BIOPSY Right 04/29/2022   US  RT BREAST BX W LOC DEV EA ADD LESION IMG BX SPEC US  GUIDE 04/29/2022 [facility]   BREAST BIOPSY Right 04/29/2022   US  RT BREAST BX W LOC DEV 1ST LESION IMG BX SPEC US  GUIDE 04/29/2022 [facility]   MOHS SURGERY  07/2021   left side of nose and left anterior chest, done at [facility]   WISDOM TOOTH EXTRACTION      OB History   No obstetric history on file.      Home Medications    Prior to Admission medications   Medication Sig Start Date End Date Taking? Authorizing Provider  amLODipine  (NORVASC ) 5 MG tablet Take 1 tablet (5 mg total) by mouth daily. 06/01/23  Yes O'Sullivan, Melissa, NP  metoprolol  succinate (TOPROL -XL) 50 MG 24 hr tablet Take  1 tablet (50 mg total) by mouth daily. Take with or immediately following a meal. 06/01/23  Yes Daryl Setter, NP  moxifloxacin  (VIGAMOX ) 0.5 % ophthalmic solution Place 1 drop into the left eye 3 (three) times daily. 11/22/23  Yes Kenyan Karnes, Asberry, PA-C    Family History Family History  Problem Relation Age of Onset   Breast cancer Mother 61   Cancer Mother 24       breast   Hypertension Mother    Cancer Father        basal cell carcinoma   Hypertension Father    Hypertension Maternal Grandmother    Hypertension Maternal Grandfather    Hypertension Paternal Grandmother    Hypertension Paternal Grandfather    Heart attack Paternal Grandfather    Colon polyps Neg Hx    Colon cancer Neg Hx    Esophageal cancer Neg Hx    Rectal cancer Neg Hx    Stomach cancer Neg Hx     Social History Social History   Tobacco Use   Smoking status: Never   Smokeless tobacco: Never  Vaping Use   Vaping status: Never Used  Substance Use Topics   Alcohol use: Yes    Alcohol/week: 7.0 standard drinks of alcohol    Types: 7 Standard drinks or equivalent per week   Drug use: No  Allergies   Patient has no known allergies.   Review of Systems Review of Systems As per HPI  Physical Exam Triage Vital Signs ED Triage Vitals  Encounter Vitals Group     BP 11/22/23 1216 133/84     Girls Systolic BP Percentile --      Girls Diastolic BP Percentile --      Boys Systolic BP Percentile --      Boys Diastolic BP Percentile --      Pulse Rate 11/22/23 1216 (!) 56     Resp 11/22/23 1216 16     Temp 11/22/23 1216 98.1 F (36.7 C)     Temp Source 11/22/23 1216 Oral     SpO2 11/22/23 1216 97 %     Weight --      Height --      Head Circumference --      Peak Flow --      Pain Score 11/22/23 1215 5     Pain Loc --      Pain Education --      Exclude from Growth Chart --    No data found.  Updated Vital Signs BP 133/84 (BP Location: Right Arm)   Pulse (!) 56   Temp 98.1 F  (36.7 C) (Oral)   Resp 16   SpO2 97%   Visual Acuity Right Eye Distance: 20/20 Left Eye Distance: 20/40 Bilateral Distance: 20/15   Physical Exam Vitals and nursing note reviewed.  Constitutional:      General: She is not in acute distress.    Appearance: She is not ill-appearing or diaphoretic.  HENT:     Mouth/Throat:     Mouth: Mucous membranes are moist.     Pharynx: Oropharynx is clear.  Eyes:     General: Lids are normal. Lids are everted, no foreign bodies appreciated. Gaze aligned appropriately.        Left eye: No foreign body, discharge or hordeolum.     Intraocular pressure: Left eye pressure is 10 mmHg.     Extraocular Movements:     Right eye: Normal extraocular motion and no nystagmus.     Left eye: Normal extraocular motion and no nystagmus.     Conjunctiva/sclera: Conjunctivae normal.     Pupils: Pupils are unequal.     Left eye: No corneal abrasion or fluorescein uptake.     Comments: Left pupil 3 mm, right pupil 2 mm. Both are round and reactive to light. Slightly diminished vision in left eye (20/40). No proptosis. No pain with eye movement. No periorbital swelling or erythema   Cardiovascular:     Rate and Rhythm: Normal rate and regular rhythm.     Pulses: Normal pulses.     Heart sounds: Normal heart sounds.  Pulmonary:     Effort: Pulmonary effort is normal.     Breath sounds: Normal breath sounds.  Musculoskeletal:     Cervical back: Normal range of motion.  Skin:    General: Skin is warm and dry.  Neurological:     Mental Status: She is alert and oriented to person, place, and time.     UC Treatments / Results  Labs (all labs ordered are listed, but only abnormal results are displayed) Labs Reviewed - No data to display  EKG  Radiology No results found.  Procedures Procedures  Medications Ordered in UC Medications - No data to display  Initial Impression / Assessment and Plan / UC Course  I have reviewed the  triage vital signs  and the nursing notes.  Pertinent labs & imaging results that were available during my care of the patient were reviewed by me and considered in my medical decision making (see chart for details).  No red flags at this time despite anisocoria At first considered this was related to the steroid eye drops she had previously been prescribed, however she used them only for 3 days, 4 weeks ago. Reassuring she has no sudden loss of vision or acute onset eye pain, no headache, no fever, no weakness in extremities.  Tonopen unsuccessful serial measurements, the first reading was 10 mmHg and no further readings able to be obtained due to equipment malfunction.  No uptake on fluorescein exam.  For comfort and possible infection treat with vigamox  TID, and call eye specialist for follow up tomorrow. Understands strict ED precautions.   Final Clinical Impressions(s) / UC Diagnoses   Final diagnoses:  Discomfort of left eye  Anisocoria     Discharge Instructions      Vigamox  -- 1 drop in the left eye, 3 times daily, for 7 days  Call Dr Octavia tomorrow morning to make a follow up appointment   With any new symptoms, especially sudden vision loss or severe eye pain, go directly to the emergency department.      ED Prescriptions     Medication Sig Dispense Auth. Provider   moxifloxacin  (VIGAMOX ) 0.5 % ophthalmic solution Place 1 drop into the left eye 3 (three) times daily. 3 mL Timothee Gali, [place]berry, PA-C      PDMP not reviewed this encounter.   Dhana Totton, Asberry, PA-C 11/22/23 1329

## 2023-11-30 ENCOUNTER — Ambulatory Visit: Payer: Managed Care, Other (non HMO) | Admitting: Family

## 2023-12-04 ENCOUNTER — Other Ambulatory Visit: Payer: Self-pay | Admitting: Family

## 2023-12-04 DIAGNOSIS — I10 Essential (primary) hypertension: Secondary | ICD-10-CM

## 2023-12-05 ENCOUNTER — Other Ambulatory Visit: Payer: Self-pay | Admitting: Family

## 2023-12-05 DIAGNOSIS — I10 Essential (primary) hypertension: Secondary | ICD-10-CM

## 2024-01-01 ENCOUNTER — Other Ambulatory Visit: Payer: Self-pay | Admitting: Family

## 2024-01-01 DIAGNOSIS — I10 Essential (primary) hypertension: Secondary | ICD-10-CM

## 2024-01-01 NOTE — Telephone Encounter (Signed)
 Please call patient to schedule a follow up visit.

## 2024-01-04 NOTE — Telephone Encounter (Signed)
 Called patient but no answer, left voice mail for patient to be aware she needs to call back for follow up appointment.

## 2024-03-14 ENCOUNTER — Encounter: Payer: Self-pay | Admitting: Family

## 2024-03-14 DIAGNOSIS — I10 Essential (primary) hypertension: Secondary | ICD-10-CM

## 2024-03-14 MED ORDER — AMLODIPINE BESYLATE 5 MG PO TABS: 5.0000 mg | ORAL_TABLET | Freq: Every day | ORAL | 0 refills | Status: DC

## 2024-03-30 ENCOUNTER — Other Ambulatory Visit: Payer: Self-pay | Admitting: Family

## 2024-03-30 DIAGNOSIS — I10 Essential (primary) hypertension: Secondary | ICD-10-CM

## 2024-06-01 ENCOUNTER — Encounter: Admitting: Family

## 2024-06-05 ENCOUNTER — Other Ambulatory Visit: Payer: Self-pay | Admitting: Family

## 2024-06-05 DIAGNOSIS — I10 Essential (primary) hypertension: Secondary | ICD-10-CM

## 2024-06-08 ENCOUNTER — Other Ambulatory Visit (HOSPITAL_BASED_OUTPATIENT_CLINIC_OR_DEPARTMENT_OTHER): Payer: Self-pay | Admitting: Family

## 2024-06-08 DIAGNOSIS — Z1231 Encounter for screening mammogram for malignant neoplasm of breast: Secondary | ICD-10-CM

## 2024-06-10 ENCOUNTER — Encounter (HOSPITAL_BASED_OUTPATIENT_CLINIC_OR_DEPARTMENT_OTHER): Payer: Self-pay | Admitting: Radiology

## 2024-06-10 ENCOUNTER — Ambulatory Visit (HOSPITAL_BASED_OUTPATIENT_CLINIC_OR_DEPARTMENT_OTHER): Admission: RE | Admit: 2024-06-10 | Discharge: 2024-06-10 | Disposition: A | Source: Ambulatory Visit

## 2024-06-10 DIAGNOSIS — Z1231 Encounter for screening mammogram for malignant neoplasm of breast: Secondary | ICD-10-CM | POA: Diagnosis present

## 2024-06-28 ENCOUNTER — Encounter: Admitting: Family

## 2024-07-15 ENCOUNTER — Encounter: Admitting: Family
